# Patient Record
Sex: Female | Born: 1950 | Race: White | Hispanic: No | Marital: Single | Smoking: Never smoker
Health system: Southern US, Community
[De-identification: ages and names within clinical notes are randomized; demographics above are authoritative.]

## PROBLEM LIST (undated history)

## (undated) DIAGNOSIS — E079 Disorder of thyroid, unspecified: Secondary | ICD-10-CM

## (undated) DIAGNOSIS — K635 Polyp of colon: Secondary | ICD-10-CM

## (undated) DIAGNOSIS — E119 Type 2 diabetes mellitus without complications: Secondary | ICD-10-CM

## (undated) DIAGNOSIS — K219 Gastro-esophageal reflux disease without esophagitis: Secondary | ICD-10-CM

## (undated) DIAGNOSIS — F419 Anxiety disorder, unspecified: Secondary | ICD-10-CM

## (undated) DIAGNOSIS — F32A Depression, unspecified: Secondary | ICD-10-CM

## (undated) DIAGNOSIS — F319 Bipolar disorder, unspecified: Secondary | ICD-10-CM

## (undated) DIAGNOSIS — K2270 Barrett's esophagus without dysplasia: Secondary | ICD-10-CM

## (undated) DIAGNOSIS — J45909 Unspecified asthma, uncomplicated: Secondary | ICD-10-CM

## (undated) DIAGNOSIS — F329 Major depressive disorder, single episode, unspecified: Secondary | ICD-10-CM

## (undated) DIAGNOSIS — D649 Anemia, unspecified: Secondary | ICD-10-CM

## (undated) HISTORY — PX: COLONOSCOPY WITH ESOPHAGOGASTRODUODENOSCOPY (EGD): SHX5779

## (undated) HISTORY — PX: CHOLECYSTECTOMY: SHX55

---

## 2004-05-26 ENCOUNTER — Ambulatory Visit: Payer: Self-pay | Admitting: Obstetrics and Gynecology

## 2005-01-14 ENCOUNTER — Ambulatory Visit: Payer: Self-pay | Admitting: General Surgery

## 2005-03-09 ENCOUNTER — Emergency Department: Payer: Self-pay | Admitting: Emergency Medicine

## 2006-07-26 ENCOUNTER — Ambulatory Visit: Payer: Self-pay | Admitting: Obstetrics and Gynecology

## 2007-08-24 ENCOUNTER — Ambulatory Visit: Payer: Self-pay | Admitting: Nurse Practitioner

## 2007-08-28 ENCOUNTER — Emergency Department: Payer: Self-pay | Admitting: Emergency Medicine

## 2007-11-04 ENCOUNTER — Ambulatory Visit: Payer: Self-pay | Admitting: Internal Medicine

## 2008-10-05 ENCOUNTER — Ambulatory Visit: Payer: Self-pay | Admitting: Nurse Practitioner

## 2008-11-11 ENCOUNTER — Ambulatory Visit: Payer: Self-pay | Admitting: Internal Medicine

## 2009-11-21 ENCOUNTER — Ambulatory Visit: Payer: Self-pay | Admitting: Nurse Practitioner

## 2010-12-11 ENCOUNTER — Ambulatory Visit: Payer: Self-pay | Admitting: Family Medicine

## 2011-02-24 ENCOUNTER — Ambulatory Visit: Payer: Self-pay | Admitting: Ophthalmology

## 2011-03-10 ENCOUNTER — Ambulatory Visit: Payer: Self-pay | Admitting: Ophthalmology

## 2011-08-14 ENCOUNTER — Ambulatory Visit: Payer: Self-pay

## 2011-09-08 ENCOUNTER — Ambulatory Visit: Payer: Self-pay | Admitting: Orthopedic Surgery

## 2012-03-07 ENCOUNTER — Ambulatory Visit: Payer: Self-pay | Admitting: Family Medicine

## 2012-12-21 ENCOUNTER — Ambulatory Visit: Payer: Self-pay | Admitting: Family Medicine

## 2013-10-10 ENCOUNTER — Ambulatory Visit: Payer: Self-pay | Admitting: Gastroenterology

## 2013-10-14 LAB — PATHOLOGY REPORT

## 2013-12-23 ENCOUNTER — Emergency Department: Payer: Self-pay | Admitting: Emergency Medicine

## 2014-10-07 NOTE — Op Note (Signed)
PATIENT NAME:  Brooke Leon, Brooke Leon MR#:  [id number] DATE OF BIRTH:  [birth year]  DATE OF PROCEDURE:  09/08/2011  PREOPERATIVE DIAGNOSIS: Left knee medial meniscus tear, osteoarthritis.   POSTOPERATIVE DIAGNOSIS: Left knee medial meniscus tear, osteoarthritis.   PROCEDURE: Arthroscopy of left knee with partial medial meniscectomy.   SURGEON: Leitha SchullerMichael J. Nevyn Bossman, MD   ANESTHESIA: General.  DESCRIPTION OF PROCEDURE: The patient was brought to the operating room and after adequate anesthesia was obtained, the left leg was placed in the arthroscopic legholder and a tourniquet applied. The leg was prepped and draped in the usual sterile manner and patient identification and time out procedures were completed. An inferolateral portal was made and the arthroscope was introduced. Initial inspection revealed significant chondromalacia with partial thickness loss of the articular cartilage within the femoral trochlea and over most of the patellar surface, although no exposed bone. The patella tracked well. Coming around medially, an inferomedial portal was made and a probe introduced. There was a complex tear of the posterior horn of the medial meniscus extending to the middle portion. This was explored and had a horizontal and vertical component present. After probing this inspection of the articular cartilage revealed moderate degenerative changes throughout the medial compartment, but no flap or other treatable condition. Going to the notch, the anterior cruciate ligament was intact laterally. There was a small flap of articular cartilage on the tibia that was loose and appeared to be fissuring, it was a loose flap, which was ablated with the use of an ArthroCare wand. Otherwise the lateral compartment was normal. At this point, the gutters were checked and there were no loose bodies. A meniscal punch was used to resect the posterior horn along with a portion of the middle third, which was also involved. Following this  the knee was thoroughly irrigated with an outflow cannula placed through the inferomedial portal and the large fragments of meniscus removed. Finally, an ArthroCare wand was used to ablate the tissue and get a stable margin for the meniscus. All instrumentation was withdrawn. The wound was closed with simple interrupted 4-0 nylon. 20 mL of 0.5% Sensorcaine with epinephrine were infiltrated. Xeroform, 4 x 4'Leon, Webril, and Ace wrap were applied. The patient was sent to the recovery room in stable condition.  ESTIMATED BLOOD LOSS: Minimal.  COMPLICATIONS: None.  SPECIMENS: None. ____________________________ Leitha SchullerMichael J. Tanairi Cypert, MD mjm:slb D: 09/08/2011 22:23:11 ET T: 09/09/2011 09:11:28 ET JOB#: [id number]  cc: Leitha SchullerMichael J. Marjo Grosvenor, MD, <Dictator>  Leitha SchullerMICHAEL J Shamarie Call MD ELECTRONICALLY SIGNED 09/10/2011 6:21

## 2016-04-14 ENCOUNTER — Other Ambulatory Visit: Payer: Self-pay | Admitting: Family Medicine

## 2016-04-14 DIAGNOSIS — R1011 Right upper quadrant pain: Secondary | ICD-10-CM

## 2016-04-17 ENCOUNTER — Ambulatory Visit
Admission: RE | Admit: 2016-04-17 | Discharge: 2016-04-17 | Disposition: A | Discharge status: Home / Self Care | Payer: BC Managed Care – PPO | Source: Ambulatory Visit | Attending: Family Medicine | Admitting: Family Medicine

## 2016-04-17 DIAGNOSIS — R1011 Right upper quadrant pain: Secondary | ICD-10-CM | POA: Insufficient documentation

## 2016-04-17 DIAGNOSIS — R932 Abnormal findings on diagnostic imaging of liver and biliary tract: Secondary | ICD-10-CM | POA: Insufficient documentation

## 2016-04-17 DIAGNOSIS — Z9049 Acquired absence of other specified parts of digestive tract: Secondary | ICD-10-CM | POA: Insufficient documentation

## 2016-09-02 ENCOUNTER — Encounter: Payer: Self-pay | Admitting: *Deleted

## 2016-09-03 ENCOUNTER — Ambulatory Visit
Admission: RE | Admit: 2016-09-03 | Discharge: 2016-09-03 | Disposition: A | Discharge status: Home / Self Care | Payer: BC Managed Care – PPO | Source: Ambulatory Visit | Attending: Gastroenterology | Admitting: Gastroenterology

## 2016-09-03 ENCOUNTER — Ambulatory Visit: Payer: BC Managed Care – PPO | Admitting: *Deleted

## 2016-09-03 ENCOUNTER — Encounter: Payer: Self-pay | Admitting: *Deleted

## 2016-09-03 ENCOUNTER — Encounter
Admission: RE | Disposition: A | Discharge status: Home / Self Care | Payer: Self-pay | Source: Ambulatory Visit | Attending: Gastroenterology

## 2016-09-03 DIAGNOSIS — Z86010 Personal history of colonic polyps: Secondary | ICD-10-CM | POA: Insufficient documentation

## 2016-09-03 DIAGNOSIS — K2270 Barrett's esophagus without dysplasia: Secondary | ICD-10-CM | POA: Diagnosis not present

## 2016-09-03 DIAGNOSIS — Z8371 Family history of colonic polyps: Secondary | ICD-10-CM | POA: Insufficient documentation

## 2016-09-03 DIAGNOSIS — K210 Gastro-esophageal reflux disease with esophagitis: Secondary | ICD-10-CM | POA: Diagnosis not present

## 2016-09-03 DIAGNOSIS — K449 Diaphragmatic hernia without obstruction or gangrene: Secondary | ICD-10-CM | POA: Insufficient documentation

## 2016-09-03 DIAGNOSIS — F319 Bipolar disorder, unspecified: Secondary | ICD-10-CM | POA: Insufficient documentation

## 2016-09-03 DIAGNOSIS — K317 Polyp of stomach and duodenum: Secondary | ICD-10-CM | POA: Insufficient documentation

## 2016-09-03 DIAGNOSIS — Z888 Allergy status to other drugs, medicaments and biological substances status: Secondary | ICD-10-CM | POA: Insufficient documentation

## 2016-09-03 DIAGNOSIS — Z79899 Other long term (current) drug therapy: Secondary | ICD-10-CM | POA: Diagnosis not present

## 2016-09-03 DIAGNOSIS — Z882 Allergy status to sulfonamides status: Secondary | ICD-10-CM | POA: Insufficient documentation

## 2016-09-03 DIAGNOSIS — F419 Anxiety disorder, unspecified: Secondary | ICD-10-CM | POA: Diagnosis not present

## 2016-09-03 DIAGNOSIS — E079 Disorder of thyroid, unspecified: Secondary | ICD-10-CM | POA: Diagnosis not present

## 2016-09-03 DIAGNOSIS — Z7984 Long term (current) use of oral hypoglycemic drugs: Secondary | ICD-10-CM | POA: Insufficient documentation

## 2016-09-03 DIAGNOSIS — Z1211 Encounter for screening for malignant neoplasm of colon: Secondary | ICD-10-CM | POA: Insufficient documentation

## 2016-09-03 DIAGNOSIS — K5730 Diverticulosis of large intestine without perforation or abscess without bleeding: Secondary | ICD-10-CM | POA: Insufficient documentation

## 2016-09-03 DIAGNOSIS — Z880 Allergy status to penicillin: Secondary | ICD-10-CM | POA: Insufficient documentation

## 2016-09-03 DIAGNOSIS — K760 Fatty (change of) liver, not elsewhere classified: Secondary | ICD-10-CM | POA: Insufficient documentation

## 2016-09-03 HISTORY — PX: ESOPHAGOGASTRODUODENOSCOPY (EGD) WITH PROPOFOL: SHX5813

## 2016-09-03 HISTORY — DX: Polyp of colon: K63.5

## 2016-09-03 HISTORY — DX: Bipolar disorder, unspecified: F31.9

## 2016-09-03 HISTORY — PX: COLONOSCOPY WITH PROPOFOL: SHX5780

## 2016-09-03 HISTORY — DX: Major depressive disorder, single episode, unspecified: F32.9

## 2016-09-03 HISTORY — DX: Anemia, unspecified: D64.9

## 2016-09-03 HISTORY — DX: Gastro-esophageal reflux disease without esophagitis: K21.9

## 2016-09-03 HISTORY — DX: Depression, unspecified: F32.A

## 2016-09-03 HISTORY — DX: Anxiety disorder, unspecified: F41.9

## 2016-09-03 HISTORY — DX: Barrett's esophagus without dysplasia: K22.70

## 2016-09-03 HISTORY — DX: Disorder of thyroid, unspecified: E07.9

## 2016-09-03 LAB — GLUCOSE, CAPILLARY: GLUCOSE-CAPILLARY: 128 mg/dL — AB (ref 65–99)

## 2016-09-03 SURGERY — ESOPHAGOGASTRODUODENOSCOPY (EGD) WITH PROPOFOL
Anesthesia: General

## 2016-09-03 MED ORDER — PROPOFOL 500 MG/50ML IV EMUL
INTRAVENOUS | Status: AC
  Filled 2016-09-03: qty 50

## 2016-09-03 MED ORDER — SODIUM CHLORIDE 0.9 % IV SOLN: INTRAVENOUS | Status: DC

## 2016-09-03 MED ORDER — PROPOFOL 10 MG/ML IV BOLUS
INTRAVENOUS | Status: AC
  Filled 2016-09-03: qty 20

## 2016-09-03 MED ORDER — EPHEDRINE SULFATE 50 MG/ML IJ SOLN
INTRAMUSCULAR | Status: DC | PRN
  Administered 2016-09-03: 10 mg via INTRAVENOUS

## 2016-09-03 MED ORDER — SODIUM CHLORIDE 0.9 % IV SOLN
INTRAVENOUS | Status: DC
  Administered 2016-09-03: 12:00:00 via INTRAVENOUS

## 2016-09-03 MED ORDER — PROPOFOL 500 MG/50ML IV EMUL
INTRAVENOUS | Status: DC | PRN
  Administered 2016-09-03: 100 ug/kg/min via INTRAVENOUS

## 2016-09-03 NOTE — Anesthesia Preprocedure Evaluation (Signed)
Anesthesia Evaluation  Patient identified by MRN, date of birth, ID band Patient awake    Reviewed: Allergy & Precautions, H&P , NPO status , Patient's Chart, lab work & pertinent test results, reviewed documented beta blocker date and time   History of Anesthesia Complications Negative for: history of anesthetic complications  Airway Mallampati: III  TM Distance: >3 FB Neck ROM: full    Dental  (+) Caps, Teeth Intact   Pulmonary neg shortness of breath, sleep apnea and Continuous Positive Airway Pressure Ventilation , neg COPD, neg recent URI,           Cardiovascular Exercise Tolerance: Good negative cardio ROS       Neuro/Psych PSYCHIATRIC DISORDERS (Bipolar) negative neurological ROS     GI/Hepatic GERD  ,NAFLD   Endo/Other  diabetesHypothyroidism   Renal/GU negative Renal ROS  negative genitourinary   Musculoskeletal   Abdominal   Peds  Hematology negative hematology ROS (+)   Anesthesia Other Findings Past Medical History: No date: Anemia No date: Anxiety No date: Barrett esophagus No date: Bipolar 1 disorder (HCC) No date: Colon polyp No date: Depression No date: GERD (gastroesophageal reflux disease) No date: Thyroid disease   Reproductive/Obstetrics negative OB ROS                             Anesthesia Physical Anesthesia Plan  ASA: III  Anesthesia Plan: General   Post-op Pain Management:    Induction:   Airway Management Planned:   Additional Equipment:   Intra-op Plan:   Post-operative Plan:   Informed Consent: I have reviewed the patients History and Physical, chart, labs and discussed the procedure including the risks, benefits and alternatives for the proposed anesthesia with the patient or authorized representative who has indicated his/her understanding and acceptance.   Dental Advisory Given  Plan Discussed with: Anesthesiologist, CRNA and  Surgeon  Anesthesia Plan Comments:         Anesthesia Quick Evaluation

## 2016-09-03 NOTE — Op Note (Signed)
[facility] Gastroenterology Patient Name: Brooke Leon Procedure Date: 09/03/2016 11:15 AM MRN: [id number] Account #: [id number] Date of Birth: 12/12/1950 Admit Type: Outpatient Age: [age] [facility] END[facility] ROOM 1 Gender: Female Note Status: Finalized Procedure:            Upper GI endoscopy Indications:          Follow-up of Barrett's esophagus Providers:            Christena DeemMartin U. Charle Mclaurin, MD Referring MD:         Rhona LeavensJames F. Burnett ShengHedrick, MD (Referring MD) Medicines:            Monitored Anesthesia Care Complications:        No immediate complications. Procedure:            Pre-Anesthesia Assessment:                       - ASA Grade Assessment: II - A patient with mild                        systemic disease.                       After obtaining informed consent, the endoscope was                        passed under direct vision. Throughout the procedure,                        the patient's blood pressure, pulse, and oxygen                        saturations were monitored continuously. The Endoscope                        was introduced through the mouth, and advanced to the                        third part of duodenum. The upper GI endoscopy was                        accomplished without difficulty. Findings:      There were esophageal mucosal changes consistent with short-segment       Barrett's esophagus present at the gastroesophageal junction. The       maximum longitudinal extent of these mucosal changes was 2 cm in length.       Mucosa was biopsied with a cold forceps for histology in 4 quadrants at       the gastroesophageal junction. One specimen bottle was sent to pathology.      A small to medium-sized hiatal hernia was found. The Z-line was a       variable distance from incisors; the hiatal hernia was sliding.      A few 1 to 3 mm sessile polyps with no bleeding and no stigmata of       recent bleeding were found in the gastric body. Biopsies were taken  with       a cold forceps for histology.      The cardia and gastric fundus were normal on retroflexion otherwise.      The examined duodenum was normal. Impression:           -  Esophageal mucosal changes consistent with                        short-segment Barrett's esophagus. Biopsied.                       - Medium-sized hiatal hernia.                       - A few gastric polyps. Biopsied.                       - Normal examined duodenum. Recommendation:       - Discharge patient to home.                       - Continue present medications.                       - Return to GI clinic in 1 month. Procedure Code(s):    --- Professional ---                       [phone], Esophagogastroduodenoscopy, flexible, transoral;                        with biopsy, single or multiple Diagnosis Code(s):    --- Professional ---                       K22.70, Barrett's esophagus without dysplasia                       K44.9, Diaphragmatic hernia without obstruction or                        gangrene                       K31.7, Polyp of stomach and duodenum CPT copyright 2016 [employer]. All rights reserved. The codes documented in this report are preliminary and upon coder review may  be revised to meet current compliance requirements. Christena Deem, MD 09/03/2016 12:44:35 PM This report has been signed electronically. Number of Addenda: 0 Note Initiated On: 09/03/2016 11:15 AM      [facility]

## 2016-09-03 NOTE — Anesthesia Post-op Follow-up Note (Cosign Needed)
Anesthesia QCDR form completed.        

## 2016-09-03 NOTE — Transfer of Care (Signed)
Immediate Anesthesia Transfer of Care Note  Patient: Brooke Leon  Procedure(s) Performed: Procedure(s): ESOPHAGOGASTRODUODENOSCOPY (EGD) WITH PROPOFOL (N/A) COLONOSCOPY WITH PROPOFOL (N/A)  Patient Location: PACU  Anesthesia Type:General  Level of Consciousness: awake, alert  and oriented  Airway & Oxygen Therapy: Patient Spontanous Breathing and Patient connected to nasal cannula oxygen  Post-op Assessment: Report given to RN and Post -op Vital signs reviewed and stable  Post vital signs: Reviewed and stable  Last Vitals:  Vitals:   09/03/16 1126  BP: 122/71  Pulse: 71  Resp: 16  Temp: 36.4 C    Last Pain:  Vitals:   09/03/16 1126  TempSrc: Tympanic         Complications: No apparent anesthesia complications

## 2016-09-03 NOTE — H&P (Signed)
Outpatient short stay form Pre-procedure 09/03/2016 12:15 PM Brooke DeemMartin U Skulskie MD  Primary Physician: Dr. Jerl MinaJames Hedrick  Reason for visit:  EGD and Colonoscopy  History of present illness:  Patient is a 66 year old female presenting today as above. She tolerated her prep well. She takes no aspirin or blood thinning agents. He has a personal history of Barrett's esophagus initially noted on biopsy about 2 years ago. She also has a family history of colon polyps and multiple primary relatives and possibly colon cancer and a primary relative as well.    Current Facility-Administered Medications:  .  0.9 %  sodium chloride infusion, , Intravenous, Continuous, Brooke DeemMartin U Skulskie, MD .  0.9 %  sodium chloride infusion, , Intravenous, Continuous, Brooke DeemMartin U Skulskie, MD, Last Rate: 20 mL/hr at 09/03/16 1158 .  0.9 %  sodium chloride infusion, , Intravenous, Continuous, Brooke DeemMartin U Skulskie, MD  Prescriptions Prior to Admission  Medication Sig Dispense Refill Last Dose  . ALPRAZolam (XANAX) 1 MG tablet Take 1 mg by mouth at bedtime as needed for anxiety.   09/03/2016 at 0300  . EPINEPHrine (EPIPEN 2-PAK) 0.3 mg/0.3 mL IJ SOAJ injection Inject into the muscle once.     . lamoTRIgine (LAMICTAL) 100 MG tablet Take 100 mg by mouth daily.   09/03/2016 at 0300  . levothyroxine (SYNTHROID, LEVOTHROID) 137 MCG tablet Take 137 mcg by mouth daily before breakfast.   09/03/2016 at 0300  . metFORMIN (GLUCOPHAGE) 500 MG tablet Take 500 mg by mouth 2 (two) times daily with a meal.   09/03/2016 at 0300  . omeprazole (PRILOSEC) 40 MG capsule Take 40 mg by mouth daily.   09/03/2016 at 0300  . simvastatin (ZOCOR) 10 MG tablet Take 10 mg by mouth daily.   09/03/2016 at 0300  . venlafaxine XR (EFFEXOR-XR) 150 MG 24 hr capsule Take 150 mg by mouth daily with breakfast.   09/03/2016 at 0300     Allergies  Allergen Reactions  . Benadryl [Diphenhydramine] Other (See Comments)    "Skin Crawling" sensation  . Penicillins Other  (See Comments)    Told by parents that she had a reaction as a child  . Sulfa Antibiotics Other (See Comments)    "Skin Crawling" sensation     Past Medical History:  Diagnosis Date  . Anemia   . Anxiety   . Barrett esophagus   . Bipolar 1 disorder (HCC)   . Colon polyp   . Depression   . GERD (gastroesophageal reflux disease)   . Thyroid disease     Review of systems:      Physical Exam    Heart and lungs: Regular rate and rhythm without rub or gallop, lungs are bilaterally clear.    HEENT: Normocephalic atraumatic eyes are anicteric    Other:     Pertinant exam for procedure: Soft nontender nondistended bowel sounds positive normoactive.    Planned proceedures: EGD, colonoscopy and indicated procedures. I have discussed the risks benefits and complications of procedures to include not limited to bleeding, infection, perforation and the risk of sedation and the patient wishes to proceed.    Brooke DeemMartin U Skulskie, MD Gastroenterology 09/03/2016  12:15 PM

## 2016-09-03 NOTE — Op Note (Addendum)
[facility] Gastroenterology Patient Name: Brooke Leon Procedure Date: 09/03/2016 11:14 AM MRN: [id number] Account #: [id number] Date of Birth: Jul 20, 1950 Admit Type: Outpatient Age: 66 Room: [facility] ENDO ROOM 1 Gender: Female Note Status: Finalized Procedure:            Colonoscopy Indications:          Family history of colonic polyps in a first-degree                        relative Providers:            Christena Deem, MD Medicines:            Monitored Anesthesia Care Complications:        No immediate complications. Procedure:            Pre-Anesthesia Assessment:                       - ASA Grade Assessment: II - A patient with mild                        systemic disease.                       After obtaining informed consent, the colonoscope was                        passed under direct vision. Throughout the procedure,                        the patient's blood pressure, pulse, and oxygen                        saturations were monitored continuously. The                        Colonoscope was introduced through the anus and                        advanced to the the cecum, identified by appendiceal                        orifice and ileocecal valve. The colonoscopy was                        performed with moderate difficulty due to a tortuous                        colon. Successful completion of the procedure was aided                        by changing the patient to a prone position. The                        patient tolerated the procedure well. The quality of                        the bowel preparation was fair. Findings:      Multiple small-mouthed diverticula were found in the sigmoid colon,       descending colon, transverse colon and ascending colon.  The digital rectal exam was normal.      The exam was otherwise without abnormality. Impression:           - Preparation of the colon was fair.                       - Diverticulosis  in the sigmoid colon, in the                        descending colon, in the transverse colon and in the                        ascending colon.                       - No specimens collected. Recommendation:       - Discharge patient to home.                       - Soft diet today, then advance as tolerated to advance                        diet as tolerated. Procedure Code(s):    --- Professional ---                       [id number], Colonoscopy, flexible; diagnostic, including                        collection of specimen(s) by brushing or washing, when                        performed (separate procedure) Diagnosis Code(s):    --- Professional ---                       Z83.71, Family history of colonic polyps                       K57.30, Diverticulosis of large intestine without                        perforation or abscess without bleeding CPT copyright 2016 American Medical Association. All rights reserved. The codes documented in this report are preliminary and upon coder review may  be revised to meet current compliance requirements. Christena DeemMartin U Skulskie, MD 09/03/2016 1:19:45 PM This report has been signed electronically. Number of Addenda: 0 Note Initiated On: 09/03/2016 11:14 AM Scope Withdrawal Time: 0 hours 10 minutes 17 seconds  Total Procedure Duration: 0 hours 28 minutes 2 seconds       [facility]

## 2016-09-04 ENCOUNTER — Encounter: Payer: Self-pay | Admitting: Gastroenterology

## 2016-09-04 LAB — SURGICAL PATHOLOGY

## 2016-09-07 NOTE — Anesthesia Postprocedure Evaluation (Signed)
Anesthesia Post Note  Patient: Brooke Leon  Procedure(s) Performed: Procedure(s) (LRB): ESOPHAGOGASTRODUODENOSCOPY (EGD) WITH PROPOFOL (N/A) COLONOSCOPY WITH PROPOFOL (N/A)  Patient location during evaluation: PACU Anesthesia Type: General Level of consciousness: awake and alert Pain management: pain level controlled Vital Signs Assessment: post-procedure vital signs reviewed and stable Respiratory status: spontaneous breathing, nonlabored ventilation, respiratory function stable and patient connected to nasal cannula oxygen Cardiovascular status: blood pressure returned to baseline and stable Postop Assessment: no signs of nausea or vomiting Anesthetic complications: no     Last Vitals:  Vitals:   09/03/16 1340 09/03/16 1350  BP: 108/67 101/70  Pulse: 66 65  Resp: 16 15  Temp:      Last Pain:  Vitals:   09/03/16 1320  TempSrc: Tympanic                 Yevette EdwardsJames G Adams

## 2016-09-14 ENCOUNTER — Other Ambulatory Visit: Payer: Self-pay | Admitting: Family Medicine

## 2016-09-14 DIAGNOSIS — Z1231 Encounter for screening mammogram for malignant neoplasm of breast: Secondary | ICD-10-CM

## 2016-10-06 ENCOUNTER — Ambulatory Visit: Payer: BC Managed Care – PPO

## 2016-10-28 ENCOUNTER — Ambulatory Visit: Payer: BC Managed Care – PPO

## 2017-01-04 ENCOUNTER — Ambulatory Visit
Admission: RE | Admit: 2017-01-04 | Discharge: 2017-01-04 | Disposition: A | Discharge status: Home / Self Care | Payer: BC Managed Care – PPO | Source: Ambulatory Visit | Attending: Family Medicine | Admitting: Family Medicine

## 2017-01-04 DIAGNOSIS — Z1231 Encounter for screening mammogram for malignant neoplasm of breast: Secondary | ICD-10-CM | POA: Diagnosis present

## 2017-12-08 ENCOUNTER — Ambulatory Visit: Payer: Medicare Other | Attending: Neurology

## 2017-12-08 DIAGNOSIS — G4733 Obstructive sleep apnea (adult) (pediatric): Secondary | ICD-10-CM | POA: Insufficient documentation

## 2017-12-08 DIAGNOSIS — G4761 Periodic limb movement disorder: Secondary | ICD-10-CM | POA: Diagnosis not present

## 2017-12-08 DIAGNOSIS — G4730 Sleep apnea, unspecified: Secondary | ICD-10-CM | POA: Diagnosis present

## 2018-01-05 ENCOUNTER — Ambulatory Visit: Payer: Medicare Other | Attending: Neurology

## 2018-01-05 DIAGNOSIS — G4761 Periodic limb movement disorder: Secondary | ICD-10-CM | POA: Insufficient documentation

## 2018-01-05 DIAGNOSIS — G4733 Obstructive sleep apnea (adult) (pediatric): Secondary | ICD-10-CM | POA: Insufficient documentation

## 2018-02-24 ENCOUNTER — Emergency Department
Admission: EM | Admit: 2018-02-24 | Discharge: 2018-02-24 | Disposition: A | Discharge status: Home / Self Care | Payer: Medicare Other | Attending: Emergency Medicine | Admitting: Emergency Medicine

## 2018-02-24 ENCOUNTER — Other Ambulatory Visit: Payer: Self-pay

## 2018-02-24 DIAGNOSIS — Z79899 Other long term (current) drug therapy: Secondary | ICD-10-CM | POA: Diagnosis not present

## 2018-02-24 DIAGNOSIS — H5711 Ocular pain, right eye: Secondary | ICD-10-CM

## 2018-02-24 DIAGNOSIS — Z7984 Long term (current) use of oral hypoglycemic drugs: Secondary | ICD-10-CM | POA: Diagnosis not present

## 2018-02-24 DIAGNOSIS — H40051 Ocular hypertension, right eye: Secondary | ICD-10-CM | POA: Diagnosis not present

## 2018-02-24 MED ORDER — FLUORESCEIN SODIUM 1 MG OP STRP
1.0000 | ORAL_STRIP | Freq: Once | OPHTHALMIC | Status: AC
  Administered 2018-02-24: 1 via OPHTHALMIC
  Filled 2018-02-24: qty 1

## 2018-02-24 MED ORDER — TIMOLOL MALEATE 0.5 % OP SOLN
1.0000 [drp] | Freq: Once | OPHTHALMIC | Status: AC
  Administered 2018-02-24: 1 [drp] via OPHTHALMIC
  Filled 2018-02-24: qty 5

## 2018-02-24 MED ORDER — TETRACAINE HCL 0.5 % OP SOLN
2.0000 [drp] | Freq: Once | OPHTHALMIC | Status: AC
  Administered 2018-02-24: 2 [drp] via OPHTHALMIC
  Filled 2018-02-24: qty 4

## 2018-02-24 MED ORDER — BRIMONIDINE TARTRATE 0.2 % OP SOLN: 1.0000 [drp] | Freq: Three times a day (TID) | OPHTHALMIC | 0 refills | Status: AC

## 2018-02-24 MED ORDER — MELOXICAM 7.5 MG PO TABS
15.0000 mg | ORAL_TABLET | Freq: Once | ORAL | Status: AC
  Administered 2018-02-24: 15 mg via ORAL
  Filled 2018-02-24: qty 2

## 2018-02-24 MED ORDER — BRIMONIDINE TARTRATE 0.15 % OP SOLN
1.0000 [drp] | Freq: Once | OPHTHALMIC | Status: AC
  Administered 2018-02-24: 1 [drp] via OPHTHALMIC
  Filled 2018-02-24: qty 5

## 2018-02-24 MED ORDER — TIMOLOL HEMIHYDRATE 0.5 % OP SOLN: 1.0000 [drp] | Freq: Two times a day (BID) | OPHTHALMIC | 0 refills | Status: AC

## 2018-02-24 NOTE — ED Notes (Signed)
Visual acuity:  L eye 20/40 R eye: 20/100

## 2018-02-24 NOTE — ED Triage Notes (Addendum)
Pt c/o R eye watering and pain. Eye is red and draining. Woke up with it this AM. Saw PCP who sent pt here. States washed eye out at home. Denies having anything in eye that she knows of or can think of. Alert, oriented, ambulatory.

## 2018-02-24 NOTE — ED Notes (Signed)
See triage note  Presents with some pain to right eye    States she is not sure if she got anything in her eye

## 2018-02-24 NOTE — ED Provider Notes (Signed)
[facility] Emergency Department Provider Note  ____________________________________________  Time seen: Approximately 5:34 PM  I have reviewed the triage vital signs and the nursing notes.   HISTORY  Chief Complaint Eye Problem    HPI Brooke Leon is a [age] y.o. female who presents the emergency department complaining of right eye redness, pain, vision changes.  Patient reports that she woke up this morning had a sharp intraocular pain.  Patient also noticed that things appear blurry.  Patient reports that she called her primary care, was assessed in the office and sent to the emergency department for further evaluation.  Patient denies any known trauma.  She states that she has had increased hearing but has had no purulent drainage from same.  Patient does have a history of bilateral lens replacement several years prior.  Patient denies any complications from same.  She describes current pain as a sharp/stabbing/aching pain intraocularly to the right side.  No symptoms to the left.    Past Medical History:  Diagnosis Date  . Anemia   . Anxiety   . Barrett esophagus   . Bipolar 1 disorder (HCC)   . Colon polyp   . Depression   . GERD (gastroesophageal reflux disease)   . Thyroid disease     There are no active problems to display for this patient.   Past Surgical History:  Procedure Laterality Date  . CHOLECYSTECTOMY     1998  . COLONOSCOPY WITH ESOPHAGOGASTRODUODENOSCOPY (EGD)    . COLONOSCOPY WITH PROPOFOL N/A 09/03/2016   Procedure: COLONOSCOPY WITH PROPOFOL;  Surgeon: Christena DeemMartin U Skulskie, MD;  [facility];  Service: Endoscopy;  Laterality: N/A;  . ESOPHAGOGASTRODUODENOSCOPY (EGD) WITH PROPOFOL N/A 09/03/2016   Procedure: ESOPHAGOGASTRODUODENOSCOPY (EGD) WITH PROPOFOL;  Surgeon: Christena DeemMartin U Skulskie, MD;  [facility] END[facility];  Service: Endoscopy;  Laterality: N/A;    Prior to Admission medications   Medication Sig Start Date End  Date Taking? Authorizing Provider  ALPRAZolam Prudy Feeler(XANAX) 1 MG tablet Take 1 mg by mouth at bedtime as needed for anxiety.    [provider]  brimonidine (ALPHAGAN) 0.2 % ophthalmic solution Place 1 drop into the right eye 3 (three) times daily. 02/24/18   Macy Lingenfelter, Delorise RoyalsJonathan D, PA-C  EPINEPHrine (EPIPEN 2-PAK) 0.3 mg/0.3 mL IJ SOAJ injection Inject into the muscle once.    [provider]  lamoTRIgine (LAMICTAL) 100 MG tablet Take 100 mg by mouth daily.    [provider]  levothyroxine (SYNTHROID, LEVOTHROID) 137 MCG tablet Take 137 mcg by mouth daily before breakfast.    [provider]  metFORMIN (GLUCOPHAGE) 500 MG tablet Take 500 mg by mouth 2 (two) times daily with a meal.    [provider]  omeprazole (PRILOSEC) 40 MG capsule Take 40 mg by mouth daily.    [provider]  simvastatin (ZOCOR) 10 MG tablet Take 10 mg by mouth daily.    [provider]  timolol (BETIMOL) 0.5 % ophthalmic solution Place 1 drop into the right eye 2 (two) times daily. 02/24/18 02/24/19  Elim Peale, Delorise RoyalsJonathan D, PA-C  venlafaxine XR (EFFEXOR-XR) 150 MG 24 hr capsule Take 150 mg by mouth daily with breakfast.    [provider]    Allergies Benadryl [diphenhydramine]; Penicillins; and Sulfa antibiotics  History reviewed. No pertinent family history.  Social History Social History   Tobacco Use  . Smoking status: Never Smoker  . Smokeless tobacco: Never Used  Substance Use Topics  . Alcohol use: No  .  Drug use: No     Review of Systems  Constitutional: No fever/chills Eyes: Blurred vision to the right eye.  Pain to the right eye.  Mild erythema to the right eye.  Excessive tearing. ENT: No upper respiratory complaints. Cardiovascular: no chest pain. Respiratory: no cough. No SOB. Gastrointestinal: No abdominal pain.  No nausea, no vomiting.  Musculoskeletal: Negative for musculoskeletal pain. Skin: Negative for rash, abrasions,  lacerations, ecchymosis. Neurological: Positive for headache but denies focal weakness or numbness. 10-point ROS otherwise negative.  ____________________________________________   PHYSICAL EXAM:  VITAL SIGNS: ED Triage Vitals  Enc Vitals Group     BP 02/24/18 1728 125/67     Pulse Rate 02/24/18 1728 75     Resp 02/24/18 1728 18     Temp 02/24/18 1728 99.1 F (37.3 C)     Temp Source 02/24/18 1728 Oral     SpO2 02/24/18 1728 98 %     Weight 02/24/18 1729 218 lb (98.9 kg)     Height 02/24/18 1729 5\' 8"  (1.727 m)     Head Circumference --      Peak Flow --      Pain Score 02/24/18 1729 8     Pain Loc --      Pain Edu? --      Excl. in [place]? --      Constitutional: Alert and oriented. Well appearing and in no acute distress. Eyes: Conjunctiva on right minimally erythematous.  Conjunctive on left is unremarkable.Marland Kitchen PERRL. EOMI. funduscopic exam bilaterally reveals no significant deficits.  Vasculature, optic disc is grossly unremarkable bilaterally.  No hyphema appreciated.  No visible foreign body in the right eye.  Both eyes are anesthetized using tetracaine.  Fluorescein staining applied to the right eye.  Small area of uptake, 3 o'clock position right eye.  No dendritic pattern.  No visible foreign body.  Tono-Pen pressures with following readings for the right eye: 32, 31, 51, 64.  Left eye: 18, 19, 21, 28. Head: Atraumatic. ENT:      Ears:       Nose: No congestion/rhinnorhea.      Mouth/Throat: Mucous membranes are moist.  Neck: No stridor.    Cardiovascular: Normal rate, regular rhythm. Normal S1 and S2.  Good peripheral circulation. Respiratory: Normal respiratory effort without tachypnea or retractions. Lungs CTAB. Good air entry to the bases with no decreased or absent breath sounds. Musculoskeletal: Full range of motion to all extremities. No gross deformities appreciated. Neurologic:  Normal speech and language. No gross focal neurologic deficits are appreciated.   Cranial nerves II through XII grossly intact. Skin:  Skin is warm, dry and intact. No rash noted. Psychiatric: Mood and affect are normal. Speech and behavior are normal. Patient exhibits appropriate insight and judgement.   ____________________________________________   LABS (all labs ordered are listed, but only abnormal results are displayed)  Labs Reviewed - No data to display ____________________________________________  EKG   ____________________________________________  RADIOLOGY   No results found.  ____________________________________________    PROCEDURES  Procedure(s) performed:    Procedures    Medications  timolol (TIMOPTIC) 0.5 % ophthalmic solution 1 drop (has no administration in time range)  brimonidine (ALPHAGAN) 0.15 % ophthalmic solution 1 drop (has no administration in time range)  meloxicam (MOBIC) tablet 15 mg (has no administration in time range)  fluorescein ophthalmic strip 1 strip (1 strip Right Eye Given by Other 02/24/18 1759)  tetracaine (PONTOCAINE) 0.5 % ophthalmic solution 2 drop (2 drops Both Eyes  Given by Other 02/24/18 1759)     ____________________________________________   INITIAL IMPRESSION / ASSESSMENT AND PLAN / ED COURSE  Pertinent labs & imaging results that were available during my care of the patient were reviewed by me and considered in my medical decision making (see chart for details).  Review of the [place] CSRS was performed in accordance of the [facility] prior to dispensing any controlled drugs.      Patient's diagnosis is consistent with right eye pain, increased ocular pressure right eye.  Patient presents emergency department with right eye pain.  She has a history of bilateral lens replacement.  Patient had nontraumatic pain, redness, excessive tearing, headache.  Exam was overall reassuring with the exception of increased ocular pressures.  I discussed the case with the on-call ophthalmologist, Dr. Sharman Crate who  states that condition is likely not some form of uveitis.  However, with increased pressures, patient is to be started on timolol and Alphagan.  Patient will see ophthalmology in the morning.  Discussed findings with patient and follow-up plan.  She is agreeable with same. Patient is given ED precautions to return to the ED for any worsening or new symptoms.     ____________________________________________  FINAL CLINICAL IMPRESSION(S) / ED DIAGNOSES  Final diagnoses:  Acute right eye pain  Elevated IOP, right      NEW MEDICATIONS STARTED DURING THIS VISIT:  ED Discharge Orders         Ordered    brimonidine (ALPHAGAN) 0.2 % ophthalmic solution  3 times daily     02/24/18 1846    timolol (BETIMOL) 0.5 % ophthalmic solution  2 times daily     02/24/18 1846              This chart was dictated using voice recognition software/Dragon. Despite best efforts to proofread, errors can occur which can change the meaning. Any change was purely unintentional.    Racheal Patches, PA-C 02/24/18 1919    Rockne Menghini, MD 02/24/18 2253

## 2018-05-18 ENCOUNTER — Other Ambulatory Visit: Payer: Self-pay | Admitting: Family Medicine

## 2018-05-18 DIAGNOSIS — Z1231 Encounter for screening mammogram for malignant neoplasm of breast: Secondary | ICD-10-CM

## 2018-08-24 ENCOUNTER — Other Ambulatory Visit: Payer: Self-pay

## 2018-08-24 ENCOUNTER — Ambulatory Visit
Admission: RE | Admit: 2018-08-24 | Discharge: 2018-08-24 | Disposition: A | Discharge status: Home / Self Care | Payer: Medicare Other | Source: Ambulatory Visit | Attending: Family Medicine | Admitting: Family Medicine

## 2018-08-24 DIAGNOSIS — Z1231 Encounter for screening mammogram for malignant neoplasm of breast: Secondary | ICD-10-CM | POA: Insufficient documentation

## 2019-01-27 ENCOUNTER — Other Ambulatory Visit: Payer: Self-pay | Admitting: Family Medicine

## 2019-01-27 DIAGNOSIS — R1031 Right lower quadrant pain: Secondary | ICD-10-CM

## 2019-01-27 DIAGNOSIS — R109 Unspecified abdominal pain: Secondary | ICD-10-CM

## 2019-02-06 ENCOUNTER — Ambulatory Visit
Admission: RE | Admit: 2019-02-06 | Discharge: 2019-02-06 | Disposition: A | Discharge status: Home / Self Care | Payer: Medicare Other | Source: Ambulatory Visit | Attending: Family Medicine | Admitting: Family Medicine

## 2019-02-06 ENCOUNTER — Other Ambulatory Visit: Payer: Self-pay

## 2019-02-06 DIAGNOSIS — R109 Unspecified abdominal pain: Secondary | ICD-10-CM | POA: Insufficient documentation

## 2019-02-06 DIAGNOSIS — R1031 Right lower quadrant pain: Secondary | ICD-10-CM | POA: Insufficient documentation

## 2019-02-06 MED ORDER — IOHEXOL 300 MG/ML  SOLN
100.0000 mL | Freq: Once | INTRAMUSCULAR | Status: AC | PRN
  Administered 2019-02-06: 100 mL via INTRAVENOUS

## 2019-02-09 ENCOUNTER — Other Ambulatory Visit: Payer: Self-pay | Admitting: Family Medicine

## 2019-02-09 ENCOUNTER — Other Ambulatory Visit: Payer: Self-pay | Admitting: *Deleted

## 2019-02-09 DIAGNOSIS — E278 Other specified disorders of adrenal gland: Secondary | ICD-10-CM

## 2019-02-09 DIAGNOSIS — R948 Abnormal results of function studies of other organs and systems: Secondary | ICD-10-CM

## 2019-02-16 ENCOUNTER — Other Ambulatory Visit: Payer: Self-pay

## 2019-02-16 ENCOUNTER — Ambulatory Visit
Admission: RE | Admit: 2019-02-16 | Discharge: 2019-02-16 | Disposition: A | Discharge status: Home / Self Care | Payer: Medicare Other | Source: Ambulatory Visit | Attending: Family Medicine | Admitting: Family Medicine

## 2019-02-16 DIAGNOSIS — E278 Other specified disorders of adrenal gland: Secondary | ICD-10-CM | POA: Diagnosis not present

## 2019-02-16 DIAGNOSIS — R948 Abnormal results of function studies of other organs and systems: Secondary | ICD-10-CM | POA: Diagnosis present

## 2019-02-16 HISTORY — DX: Type 2 diabetes mellitus without complications: E11.9

## 2019-02-16 HISTORY — DX: Unspecified asthma, uncomplicated: J45.909

## 2019-02-16 MED ORDER — IOHEXOL 300 MG/ML  SOLN
100.0000 mL | Freq: Once | INTRAMUSCULAR | Status: AC | PRN
  Administered 2019-02-16: 100 mL via INTRAVENOUS

## 2019-04-23 ENCOUNTER — Emergency Department
Admission: EM | Admit: 2019-04-23 | Discharge: 2019-04-23 | Disposition: A | Discharge status: Home / Self Care | Payer: Medicare Other | Attending: Emergency Medicine | Admitting: Emergency Medicine

## 2019-04-23 ENCOUNTER — Emergency Department: Payer: Medicare Other

## 2019-04-23 DIAGNOSIS — E079 Disorder of thyroid, unspecified: Secondary | ICD-10-CM | POA: Insufficient documentation

## 2019-04-23 DIAGNOSIS — Z23 Encounter for immunization: Secondary | ICD-10-CM | POA: Diagnosis not present

## 2019-04-23 DIAGNOSIS — Z2914 Encounter for prophylactic rabies immune globin: Secondary | ICD-10-CM | POA: Diagnosis not present

## 2019-04-23 DIAGNOSIS — Y9302 Activity, running: Secondary | ICD-10-CM | POA: Diagnosis not present

## 2019-04-23 DIAGNOSIS — Y998 Other external cause status: Secondary | ICD-10-CM | POA: Insufficient documentation

## 2019-04-23 DIAGNOSIS — E119 Type 2 diabetes mellitus without complications: Secondary | ICD-10-CM | POA: Insufficient documentation

## 2019-04-23 DIAGNOSIS — J45909 Unspecified asthma, uncomplicated: Secondary | ICD-10-CM | POA: Insufficient documentation

## 2019-04-23 DIAGNOSIS — W5581XA Bitten by other mammals, initial encounter: Secondary | ICD-10-CM | POA: Diagnosis not present

## 2019-04-23 DIAGNOSIS — S61451A Open bite of right hand, initial encounter: Secondary | ICD-10-CM | POA: Insufficient documentation

## 2019-04-23 DIAGNOSIS — S61452A Open bite of left hand, initial encounter: Secondary | ICD-10-CM | POA: Diagnosis present

## 2019-04-23 DIAGNOSIS — Z888 Allergy status to other drugs, medicaments and biological substances status: Secondary | ICD-10-CM | POA: Insufficient documentation

## 2019-04-23 DIAGNOSIS — Z882 Allergy status to sulfonamides status: Secondary | ICD-10-CM | POA: Diagnosis not present

## 2019-04-23 DIAGNOSIS — Y929 Unspecified place or not applicable: Secondary | ICD-10-CM | POA: Insufficient documentation

## 2019-04-23 DIAGNOSIS — T148XXA Other injury of unspecified body region, initial encounter: Secondary | ICD-10-CM

## 2019-04-23 DIAGNOSIS — Z203 Contact with and (suspected) exposure to rabies: Secondary | ICD-10-CM | POA: Insufficient documentation

## 2019-04-23 DIAGNOSIS — Z7984 Long term (current) use of oral hypoglycemic drugs: Secondary | ICD-10-CM | POA: Insufficient documentation

## 2019-04-23 DIAGNOSIS — Z79899 Other long term (current) drug therapy: Secondary | ICD-10-CM | POA: Diagnosis not present

## 2019-04-23 DIAGNOSIS — Z880 Allergy status to penicillin: Secondary | ICD-10-CM | POA: Diagnosis not present

## 2019-04-23 MED ORDER — HYDROCODONE-ACETAMINOPHEN 5-325 MG PO TABS: 1.0000 | ORAL_TABLET | Freq: Four times a day (QID) | ORAL | 0 refills | Status: DC | PRN

## 2019-04-23 MED ORDER — RABIES IMMUNE GLOBULIN 150 UNIT/ML IM INJ
300.0000 [IU] | INJECTION | Freq: Once | INTRAMUSCULAR | Status: AC
  Administered 2019-04-23: 300 [IU] via INTRAMUSCULAR
  Filled 2019-04-23: qty 2

## 2019-04-23 MED ORDER — RABIES IMMUNE GLOBULIN 150 UNIT/ML IM INJ
20.0000 [IU]/kg | INJECTION | Freq: Once | INTRAMUSCULAR | Status: DC
  Filled 2019-04-23: qty 12

## 2019-04-23 MED ORDER — TETANUS-DIPHTH-ACELL PERTUSSIS 5-2.5-18.5 LF-MCG/0.5 IM SUSP
0.5000 mL | Freq: Once | INTRAMUSCULAR | Status: AC
  Administered 2019-04-23: 09:00:00 0.5 mL via INTRAMUSCULAR
  Filled 2019-04-23: qty 0.5

## 2019-04-23 MED ORDER — CEPHALEXIN 500 MG PO CAPS: 500.0000 mg | ORAL_CAPSULE | Freq: Three times a day (TID) | ORAL | 0 refills | Status: DC

## 2019-04-23 MED ORDER — RABIES VACCINE, PCEC IM SUSR
1.0000 mL | Freq: Once | INTRAMUSCULAR | Status: AC
  Administered 2019-04-23: 1 mL via INTRAMUSCULAR
  Filled 2019-04-23: qty 1

## 2019-04-23 MED ORDER — RABIES IMMUNE GLOBULIN 150 UNIT/ML IM INJ
1500.0000 [IU] | INJECTION | Freq: Once | INTRAMUSCULAR | Status: AC
  Administered 2019-04-23: 1500 [IU] via INTRAMUSCULAR
  Filled 2019-04-23: qty 10

## 2019-04-23 NOTE — ED Notes (Signed)
Dressings applied to bilat hand/fingers/wrist

## 2019-04-23 NOTE — ED Notes (Signed)
[place] the park and saw a log in the way - pt went to move the "log" and whatever it was attacked her causing injury to bilat hands/wrist Pt is not current on tetanus shot

## 2019-04-23 NOTE — ED Notes (Signed)
Irrigated and scrubbed pt wounds to bilat hands and wrist - swelling and bruising noted to right middle finger and left wrist - also multiple puncture wounds of various sizes - [employer] officer at bedside taking report now

## 2019-04-23 NOTE — Discharge Instructions (Signed)
Clean areas on your hands daily with mild soap and water.  Watch for any signs of infection.  Return to the emergency department for continued rabies immunization.  Take antibiotic 3 times a day for the next 7 days.  Discontinue if any rash.  Return to the emergency department if any difficulties with taking this.  Norco as needed for pain.  You may need to elevate your hands as needed for swelling and help with pain.  Return to the emergency department on 11/ 11 , 11/15, 11/22

## 2019-04-23 NOTE — ED Notes (Signed)
Animal control contacted and they will send Stage manager as soon as possible

## 2019-04-23 NOTE — ED Triage Notes (Signed)
Patient presents to ED for multiple cat bites. Patient [place] [employer] [place] and picked up what she thinks is a cat. The cat started biting her and she threw it down. Patient is complaining that we aren't doing anything for her and she is "just bleeding" and she is "on fire". Patient is not really sure now that it was actually a cat.

## 2019-04-23 NOTE — ED Provider Notes (Signed)
[facility] Emergency Department Provider Note  ___________________________________________   First MD Initiated Contact with Patient 04/23/19 0708     (approximate)  I have reviewed the triage vital signs and the nursing notes.   HISTORY  Chief Complaint Animal Bite   HPI Brooke Leon is a 68 y.o. female presents to the ED after she was attacked by an animal.  Patient states that she was walking early this morning and there was "a log" in her path that she was going to lift and move over.  This was not a log and the animal attacked her.  She is not sure what it was.  She describes how large it was which sounds unlikely that it was a cat.  [place] PD was in to talk with her.  She has multiple puncture wounds on her hands bilaterally and wrist.  No active bleeding.  Patient is not up-to-date on tetanus.  She is upset and tearful.  Is most unlikely that we find out what type of animal bit her.  She rates her pain as an 8 out of 10.     Past Medical History:  Diagnosis Date  . Anemia   . Anxiety   . Asthma   . Barrett esophagus   . Bipolar 1 disorder (HCC)   . Colon polyp   . Depression   . Diabetes mellitus without complication (HCC)   . GERD (gastroesophageal reflux disease)   . Thyroid disease     There are no active problems to display for this patient.   Past Surgical History:  Procedure Laterality Date  . CHOLECYSTECTOMY     1998  . COLONOSCOPY WITH ESOPHAGOGASTRODUODENOSCOPY (EGD)    . COLONOSCOPY WITH PROPOFOL N/A 09/03/2016   Procedure: COLONOSCOPY WITH PROPOFOL;  Surgeon: Christena DeemMartin U Skulskie, MD;  [facility];  Service: Endoscopy;  Laterality: N/A;  . ESOPHAGOGASTRODUODENOSCOPY (EGD) WITH PROPOFOL N/A 09/03/2016   Procedure: ESOPHAGOGASTRODUODENOSCOPY (EGD) WITH PROPOFOL;  Surgeon: Christena DeemMartin U Skulskie, MD;  [facility];  Service: Endoscopy;  Laterality: N/A;    Prior to Admission medications   Medication Sig  Start Date End Date Taking? Authorizing Provider  ALPRAZolam Prudy Feeler(XANAX) 1 MG tablet Take 1 mg by mouth at bedtime as needed for anxiety.    [provider]  brimonidine (ALPHAGAN) 0.2 % ophthalmic solution Place 1 drop into the right eye 3 (three) times daily. 02/24/18   Cuthriell, Delorise RoyalsJonathan D, PA-C  cephALEXin (KEFLEX) 500 MG capsule Take 1 capsule (500 mg total) by mouth 3 (three) times daily. 04/23/19   Tommi RumpsSummers, Rhonda L, PA-C  EPINEPHrine (EPIPEN 2-PAK) 0.3 mg/0.3 mL IJ SOAJ injection Inject into the muscle once.    [provider]  HYDROcodone-acetaminophen (NORCO/VICODIN) 5-325 MG tablet Take 1 tablet by mouth every 6 (six) hours as needed for moderate pain. 04/23/19   Tommi RumpsSummers, Rhonda L, PA-C  lamoTRIgine (LAMICTAL) 100 MG tablet Take 100 mg by mouth daily.    [provider]  levothyroxine (SYNTHROID, LEVOTHROID) 137 MCG tablet Take 137 mcg by mouth daily before breakfast.    [provider]  metFORMIN (GLUCOPHAGE) 500 MG tablet Take 500 mg by mouth 2 (two) times daily with a meal.    [provider]  omeprazole (PRILOSEC) 40 MG capsule Take 40 mg by mouth daily.    [provider]  simvastatin (ZOCOR) 10 MG tablet Take 10 mg by mouth daily.    [provider]  venlafaxine XR (EFFEXOR-XR) 150 MG 24 hr  capsule Take 150 mg by mouth daily with breakfast.    [provider]    Allergies Benadryl [diphenhydramine], Penicillins, and Sulfa antibiotics  Family History  Problem Relation Age of Onset  . Breast cancer Neg Hx     Social History Social History   Tobacco Use  . Smoking status: Never Smoker  . Smokeless tobacco: Never Used  Substance Use Topics  . Alcohol use: No  . Drug use: No    Review of Systems Constitutional: No fever/chills Cardiovascular: Denies chest pain. Respiratory: Denies shortness of breath. Gastrointestinal:  No nausea, no vomiting. Musculoskeletal: Bilateral hand pain. Skin: Positive  multiple puncture wounds. Neurological: Negative for headaches, focal weakness or numbness. ____________________________________________   PHYSICAL EXAM:  VITAL SIGNS: ED Triage Vitals  Enc Vitals Group     BP 04/23/19 0649 (!) 114/101     Pulse Rate 04/23/19 0649 84     Resp 04/23/19 0649 (!) 22     Temp 04/23/19 0649 98.5 F (36.9 C)     Temp Source 04/23/19 0649 Oral     SpO2 04/23/19 0649 95 %     Weight 04/23/19 0650 200 lb (90.7 kg)     Height 04/23/19 0650 5\' 8"  (1.727 m)     Head Circumference --      Peak Flow --      Pain Score 04/23/19 0650 8     Pain Loc --      Pain Edu? --      Excl. in [place]? --     Constitutional: Alert and oriented. Well appearing and in no acute distress. Eyes: Conjunctivae are normal.  Head: Atraumatic. Neck: No stridor.   Cardiovascular: Normal rate, regular rhythm. Grossly normal heart sounds.  Good peripheral circulation. Respiratory: Normal respiratory effort.  No retractions. Lungs CTAB. Musculoskeletal: Bilateral hands have multiple punctures various sizes dorsal and volar aspect.  Patient is able to flex and extend all digits without any difficulty.  There is minimal bleeding but no active bleeding present.  Capillary refill is less than 3 seconds.  Wrist bilaterally are also affected.  No difficulty with range of motion. Neurologic:  Normal speech and language. No gross focal neurologic deficits are appreciated. No gait instability. Skin:  Skin is warm, dry and intact. No rash noted.  No puncture wounds are noted to the face or throat. Psychiatric: Mood and affect are normal. Speech and behavior are normal.  ____________________________________________   LABS (all labs ordered are listed, but only abnormal results are displayed)  Labs Reviewed - No data to display  RADIOLOGY   Official radiology report(s): Dg Hand Complete Left  Result Date: 04/23/2019 CLINICAL DATA:  Multiple animal bite to both hands. EXAM: LEFT HAND -  COMPLETE 3+ VIEW COMPARISON:  None. FINDINGS: Examination demonstrates minimal degenerate change over the radiocarpal joint and first carpometacarpal joint. Mild degenerate change over the first interphalangeal joint. No evidence of fracture or dislocation. No radiopaque foreign body. No air in the soft tissues. IMPRESSION: No acute findings. Mild degenerative changes as described. Electronically Signed   By: Marin Olp M.D.   On: 04/23/2019 09:09   Dg Hand Complete Right  Result Date: 04/23/2019 CLINICAL DATA:  Patient with multiple animal bite. EXAM: RIGHT HAND - COMPLETE 3+ VIEW COMPARISON:  None. FINDINGS: Normal anatomic alignment. No evidence for acute fracture or dislocation. No radiopaque foreign body. IMPRESSION: No acute osseous abnormality. No radiopaque foreign body. Electronically Signed   By: Lovey Newcomer M.D.   On:  04/23/2019 09:10    ____________________________________________   PROCEDURES  Procedure(s) performed (including Critical Care):  Procedures   ____________________________________________   INITIAL IMPRESSION / ASSESSMENT AND PLAN / ED COURSE  As part of my medical decision making, I reviewed the following data within the electronic MEDICAL RECORD NUMBER Notes from prior ED visits and [place] Controlled Substance Database  68 year old female presents to the ED after being attacked by a unknown animal that she tried to pick up while she was walking very early this morning.  She states that she thought that the object was a walk and she tried to pick it up when it attacked her.  Patient describes the size of this animal and it is felt not to be a cat but possibly either a possum or raccoon.  [place] PD was present to take a report.  Patient's tetanus was updated.  She was given a prescription for Keflex for infection prevention.  She was instructed to clean these areas daily with mild soap and water.  She was also given a prescription for pain medication.  Rabies vaccine  was initiated and she was given the dates that she will need to return for the remaining injections.  X-rays were reassuring that there was no foreign body or acute bony injury.  Patient was told to return to the emergency department if any severe worsening of her symptoms.   ____________________________________________   FINAL CLINICAL IMPRESSION(S) / ED DIAGNOSES  Final diagnoses:  Animal bite  Need for post exposure prophylaxis for rabies     ED Discharge Orders         Ordered    cephALEXin (KEFLEX) 500 MG capsule  3 times daily     04/23/19 0938    HYDROcodone-acetaminophen (NORCO/VICODIN) 5-325 MG tablet  Every 6 hours PRN     04/23/19 3790           Note:  This document was prepared using Dragon voice recognition software and may include unintentional dictation errors.    Tommi Rumps, PA-C 04/23/19 1458    Arnaldo Natal, MD 04/23/19 1745

## 2019-04-23 NOTE — ED Triage Notes (Signed)
ADDENDUM: Patient has multiple puncture wounds to both hands.

## 2019-04-28 ENCOUNTER — Emergency Department
Admission: EM | Admit: 2019-04-28 | Discharge: 2019-04-28 | Disposition: A | Discharge status: Home / Self Care | Payer: Medicare Other | Attending: Emergency Medicine | Admitting: Emergency Medicine

## 2019-04-28 ENCOUNTER — Other Ambulatory Visit: Payer: Self-pay

## 2019-04-28 ENCOUNTER — Encounter: Payer: Self-pay | Admitting: Emergency Medicine

## 2019-04-28 DIAGNOSIS — Z7984 Long term (current) use of oral hypoglycemic drugs: Secondary | ICD-10-CM | POA: Diagnosis not present

## 2019-04-28 DIAGNOSIS — S61451A Open bite of right hand, initial encounter: Secondary | ICD-10-CM | POA: Diagnosis not present

## 2019-04-28 DIAGNOSIS — Z23 Encounter for immunization: Secondary | ICD-10-CM | POA: Insufficient documentation

## 2019-04-28 DIAGNOSIS — E039 Hypothyroidism, unspecified: Secondary | ICD-10-CM | POA: Diagnosis not present

## 2019-04-28 DIAGNOSIS — J45909 Unspecified asthma, uncomplicated: Secondary | ICD-10-CM | POA: Diagnosis not present

## 2019-04-28 DIAGNOSIS — S61452A Open bite of left hand, initial encounter: Secondary | ICD-10-CM | POA: Diagnosis not present

## 2019-04-28 DIAGNOSIS — Z203 Contact with and (suspected) exposure to rabies: Secondary | ICD-10-CM | POA: Diagnosis not present

## 2019-04-28 DIAGNOSIS — W5581XA Bitten by other mammals, initial encounter: Secondary | ICD-10-CM | POA: Insufficient documentation

## 2019-04-28 DIAGNOSIS — Z79899 Other long term (current) drug therapy: Secondary | ICD-10-CM | POA: Insufficient documentation

## 2019-04-28 DIAGNOSIS — E119 Type 2 diabetes mellitus without complications: Secondary | ICD-10-CM | POA: Insufficient documentation

## 2019-04-28 MED ORDER — RABIES VACCINE, PCEC IM SUSR
1.0000 mL | Freq: Once | INTRAMUSCULAR | Status: AC
  Administered 2019-04-28: 1 mL via INTRAMUSCULAR
  Filled 2019-04-28: qty 1

## 2019-04-28 NOTE — Discharge Instructions (Addendum)
Your rabies series is out of sync  since you missed an appointment.  Return on May 02, 2019 to continue series.

## 2019-04-28 NOTE — ED Provider Notes (Signed)
[facility] Emergency Department Provider Note   ____________________________________________   First MD Initiated Contact with Patient 04/28/19 1346     (approximate)  I have reviewed the triage vital signs and the nursing notes.   HISTORY  Chief Complaint Rabies Injection    HPI Brooke Leon is a 68 y.o. female patient returns for second rabies injection.  Patient is 2 days late from schedule injection.  Patient states she has transportation problems and cannot make it on her scheduled date.  Patient denies any reaction from the previous injections.  Patient voices concern about continued swelling to the third digit right hand.  Patient is taking antibiotics as directed.  Patient denies fever or pain.       Past Medical History:  Diagnosis Date  . Anemia   . Anxiety   . Asthma   . Barrett esophagus   . Bipolar 1 disorder (HCC)   . Colon polyp   . Depression   . Diabetes mellitus without complication (HCC)   . GERD (gastroesophageal reflux disease)   . Thyroid disease     There are no active problems to display for this patient.   Past Surgical History:  Procedure Laterality Date  . CHOLECYSTECTOMY     1998  . COLONOSCOPY WITH ESOPHAGOGASTRODUODENOSCOPY (EGD)    . COLONOSCOPY WITH PROPOFOL N/A 09/03/2016   Procedure: COLONOSCOPY WITH PROPOFOL;  Surgeon: Christena Deem, MD;  Location: [facility] ENDOSCOPY;  Service: Endoscopy;  Laterality: N/A;  . ESOPHAGOGASTRODUODENOSCOPY (EGD) WITH PROPOFOL N/A 09/03/2016   Procedure: ESOPHAGOGASTRODUODENOSCOPY (EGD) WITH PROPOFOL;  Surgeon: Christena Deem, MD;  Location: [facility] ENDOSCOPY;  Service: Endoscopy;  Laterality: N/A;    Prior to Admission medications   Medication Sig Start Date End Date Taking? Authorizing Provider  ALPRAZolam Prudy Feeler) 1 MG tablet Take 1 mg by mouth at bedtime as needed for anxiety.    [provider]  brimonidine (ALPHAGAN) 0.2 % ophthalmic solution Place 1 drop  into the right eye 3 (three) times daily. 02/24/18   Cuthriell, Delorise Royals, PA-C  cephALEXin (KEFLEX) 500 MG capsule Take 1 capsule (500 mg total) by mouth 3 (three) times daily. 04/23/19   Tommi Rumps, PA-C  EPINEPHrine (EPIPEN 2-PAK) 0.3 mg/0.3 mL IJ SOAJ injection Inject into the muscle once.    [provider]  HYDROcodone-acetaminophen (NORCO/VICODIN) 5-325 MG tablet Take 1 tablet by mouth every 6 (six) hours as needed for moderate pain. 04/23/19   Tommi Rumps, PA-C  lamoTRIgine (LAMICTAL) 100 MG tablet Take 100 mg by mouth daily.    [provider]  levothyroxine (SYNTHROID, LEVOTHROID) 137 MCG tablet Take 137 mcg by mouth daily before breakfast.    [provider]  metFORMIN (GLUCOPHAGE) 500 MG tablet Take 500 mg by mouth 2 (two) times daily with a meal.    [provider]  omeprazole (PRILOSEC) 40 MG capsule Take 40 mg by mouth daily.    [provider]  simvastatin (ZOCOR) 10 MG tablet Take 10 mg by mouth daily.    [provider]  venlafaxine XR (EFFEXOR-XR) 150 MG 24 hr capsule Take 150 mg by mouth daily with breakfast.    [provider]    Allergies Benadryl [diphenhydramine], Penicillins, and Sulfa antibiotics  Family History  Problem Relation Age of Onset  . Breast cancer Neg Hx     Social History Social History   Tobacco Use  . Smoking status: Never Smoker  . Smokeless tobacco: Never Used  Substance  Use Topics  . Alcohol use: No  . Drug use: No    Review of Systems  Constitutional: No fever/chills Eyes: No visual changes. ENT: No sore throat. Cardiovascular: Denies chest pain. Respiratory: Denies shortness of breath. Gastrointestinal: No abdominal pain.  No nausea, no vomiting.  No diarrhea.  No constipation. Genitourinary: Negative for dysuria. Musculoskeletal: Negative for back pain. Skin: Negative for rash. Neurological: Negative for headaches, focal weakness or numbness. Psychiatric:   Anxiety, bipolar, depression. Endocrine:  Diabetes and hypothyroidism. Hematological/Lymphatic:  Allergic/Immunilogical: Benadryl, penicillin, sulfur antibiotics. ____________________________________________   PHYSICAL EXAM:  VITAL SIGNS: ED Triage Vitals  Enc Vitals Group     BP 04/28/19 1309 130/77     Pulse Rate 04/28/19 1309 68     Resp 04/28/19 1309 16     Temp 04/28/19 1309 98.5 F (36.9 C)     Temp Source 04/28/19 1309 Oral     SpO2 04/28/19 1309 98 %     Weight 04/28/19 1303 199 lb (90.3 kg)     Height 04/28/19 1303 5\' 9"  (1.753 m)     Head Circumference --      Peak Flow --      Pain Score 04/28/19 1302 0     Pain Loc --      Pain Edu? --      Excl. in [place]? --     Constitutional: Alert and oriented. Well appearing and in no acute distress. Hematological/Lymphatic/Immunilogical: No cervical lymphadenopathy. Cardiovascular: Normal rate, regular rhythm. Grossly normal heart sounds.  Good peripheral circulation. Respiratory: Normal respiratory effort.  No retractions. Lungs CTAB. Gastrointestinal: Soft and nontender. No distention. No abdominal bruits. No CVA tenderness. Neurologic:  Normal speech and language. No gross focal neurologic deficits are appreciated. No gait instability. Skin: Bilateral hands with multiple bites and scratches.  Edema to the third digit right hand. Psychiatric: Mood and affect are normal. Speech and behavior are normal.  ____________________________________________   LABS (all labs ordered are listed, but only abnormal results are displayed)  Labs Reviewed - No data to display ____________________________________________  EKG   ____________________________________________  RADIOLOGY  ED MD interpretation:    Official radiology report(s): No results found.  ____________________________________________   PROCEDURES  Procedure(s) performed (including Critical Care):  Procedures    ____________________________________________   INITIAL IMPRESSION / ASSESSMENT AND PLAN / ED COURSE  As part of my medical decision making, I reviewed the following data within the [place] was evaluated in Emergency Department on 04/28/2019 for the symptoms described in the history of present illness. She was evaluated in the context of the global COVID-19 pandemic, which necessitated consideration that the patient might be at risk for infection with the SARS-CoV-2 virus that causes COVID-19. Institutional protocols and algorithms that pertain to the evaluation of patients at risk for COVID-19 are in a state of rapid change based on information released by regulatory bodies including the CDC and federal and state organizations. These policies and algorithms were followed during the patient's care in the ED.  Patient presents for second rabies shot.  Patient is scheduled series.  Patient says to be recalculated.  Patient given shot and advised on wound care.  Patient states she will [facility] with [facility] to complete the series.      ____________________________________________   FINAL CLINICAL IMPRESSION(S) / ED DIAGNOSES  Final diagnoses:  Contact with and (suspected) exposure to rabies  ED Discharge Orders    None       Note:  This document was prepared using Dragon voice recognition software and may include unintentional dictation errors.    Joni ReiningSmith, Alaiza Yau K, PA-C 04/28/19 1543    Sharyn CreamerQuale, Mark, MD 04/28/19 1743

## 2019-04-28 NOTE — ED Triage Notes (Signed)
Pt here for second rabies injection 

## 2019-05-02 ENCOUNTER — Other Ambulatory Visit: Payer: Self-pay

## 2019-05-02 ENCOUNTER — Ambulatory Visit
Admission: EM | Admit: 2019-05-02 | Discharge: 2019-05-02 | Disposition: A | Discharge status: Home / Self Care | Payer: Medicare Other | Attending: Family Medicine | Admitting: Family Medicine

## 2019-05-02 DIAGNOSIS — Z203 Contact with and (suspected) exposure to rabies: Secondary | ICD-10-CM | POA: Diagnosis not present

## 2019-05-02 DIAGNOSIS — Z23 Encounter for immunization: Secondary | ICD-10-CM | POA: Diagnosis not present

## 2019-05-02 DIAGNOSIS — T148XXA Other injury of unspecified body region, initial encounter: Secondary | ICD-10-CM

## 2019-05-02 DIAGNOSIS — L089 Local infection of the skin and subcutaneous tissue, unspecified: Secondary | ICD-10-CM

## 2019-05-02 MED ORDER — RABIES VACCINE, PCEC IM SUSR
1.0000 mL | Freq: Once | INTRAMUSCULAR | Status: AC
  Administered 2019-05-02: 15:00:00 1 mL via INTRAMUSCULAR

## 2019-05-02 MED ORDER — DOXYCYCLINE HYCLATE 100 MG PO CAPS: 100.0000 mg | ORAL_CAPSULE | Freq: Two times a day (BID) | ORAL | 0 refills | Status: DC

## 2019-05-02 MED ORDER — METRONIDAZOLE 500 MG PO TABS: 500.0000 mg | ORAL_TABLET | Freq: Three times a day (TID) | ORAL | 0 refills | Status: AC

## 2019-05-02 NOTE — Discharge Instructions (Signed)
Keep wounds clean.  Return in 7 days for last dose of Rabies vaccine.  Antibiotics as prescribed.  Take care  Dr. Lacinda Axon

## 2019-05-02 NOTE — ED Triage Notes (Addendum)
Pt states that she is here for second injection of rabies shot. In charting, appears that this will be patient's third rabies injection.  Pt requesting that she is evaluated by a provider for wound check to her bilateral hand wounds from initial animal attack. Evaluated at [facility] initially.

## 2019-05-02 NOTE — ED Provider Notes (Signed)
[facility]    CSN: [id number] Arrival date & time: 05/02/19  1417  History   Chief Complaint Chief Complaint  Patient presents with  . Rabies Injection   HPI  68 year old female presents for a wound check and for additional rabies vaccination.  Patient suffered multiple bites/puncture wounds on 11/8 (Hands). Patient is unsure of the animal that attacked her.  This is reported similarly in the encounter from 11/8.  She was placed on Keflex and was given rabies vaccine.  Patient was seen on 11/13 for second dose.  This was on day 5.  Patient presents today for her third rabies injection.  Patient also desires her wounds to be evaluated.  She continues to have pain of the hands where she suffered the abrasions.  She has significant swelling of the right middle finger.  She is concerned about infection in the setting of her recent punctures.  No fever.  No drainage.  Pain is currently 7/10 in severity.  No other associated symptoms.  No other complaints.  PMH, Surgical Hx, Family Hx, Social History reviewed and updated as below.  Past Medical History:  Diagnosis Date  . Anemia   . Anxiety   . Asthma   . Barrett esophagus   . Bipolar 1 disorder ([place])   . Colon polyp   . Depression   . Diabetes mellitus without complication ([place])   . GERD (gastroesophageal reflux disease)   . Thyroid disease    Past Surgical History:  Procedure Laterality Date  . CHOLECYSTECTOMY     1998  . COLONOSCOPY WITH ESOPHAGOGASTRODUODENOSCOPY (EGD)    . COLONOSCOPY WITH PROPOFOL N/A 09/03/2016   Procedure: COLONOSCOPY WITH PROPOFOL;  Surgeon: Lollie Sails, MD;  Location: [facility] ENDOSCOPY;  Service: Endoscopy;  Laterality: N/A;  . ESOPHAGOGASTRODUODENOSCOPY (EGD) WITH PROPOFOL N/A 09/03/2016   Procedure: ESOPHAGOGASTRODUODENOSCOPY (EGD) WITH PROPOFOL;  Surgeon: Lollie Sails, MD;  Location: [facility] ENDOSCOPY;  Service: Endoscopy;  Laterality: N/A;    OB History   No obstetric history on  file.      Home Medications    Prior to Admission medications   Medication Sig Start Date End Date Taking? Authorizing Provider  ALPRAZolam Duanne Moron) 1 MG tablet Take 1 mg by mouth at bedtime as needed for anxiety.   Yes [provider]  HYDROcodone-acetaminophen (NORCO/VICODIN) 5-325 MG tablet Take 1 tablet by mouth every 6 (six) hours as needed for moderate pain. 04/23/19  Yes Johnn Hai, PA-C  lamoTRIgine (LAMICTAL) 100 MG tablet Take 100 mg by mouth daily.   Yes [provider]  levothyroxine (SYNTHROID, LEVOTHROID) 137 MCG tablet Take 137 mcg by mouth daily before breakfast.   Yes [provider]  metFORMIN (GLUCOPHAGE) 500 MG tablet Take 500 mg by mouth 2 (two) times daily with a meal.   Yes [provider]  omeprazole (PRILOSEC) 40 MG capsule Take 40 mg by mouth daily.   Yes [provider]  simvastatin (ZOCOR) 10 MG tablet Take 10 mg by mouth daily.   Yes [provider]  venlafaxine XR (EFFEXOR-XR) 150 MG 24 hr capsule Take 150 mg by mouth daily with breakfast.   Yes [provider]  brimonidine (ALPHAGAN) 0.2 % ophthalmic solution Place 1 drop into the right eye 3 (three) times daily. 02/24/18   Cuthriell, Charline Bills, PA-C  doxycycline (VIBRAMYCIN) 100 MG capsule Take 1 capsule (100 mg total) by mouth 2 (two) times daily. 05/02/19   Coral Spikes, DO  EPINEPHrine (EPIPEN 2-PAK)  0.3 mg/0.3 mL IJ SOAJ injection Inject into the muscle once.    [provider]  metroNIDAZOLE (FLAGYL) 500 MG tablet Take 1 tablet (500 mg total) by mouth 3 (three) times daily for 7 days. 05/02/19 05/09/19  Tommie Sams, DO    Family History Family History  Problem Relation Age of Onset  . Breast cancer Neg Hx     Social History Social History   Tobacco Use  . Smoking status: Never Smoker  . Smokeless tobacco: Never Used  Substance Use Topics  . Alcohol use: No  . Drug use: No     Allergies   Benadryl  [diphenhydramine], Penicillins, and Sulfa antibiotics   Review of Systems Review of Systems  Constitutional: Negative.   Skin: Positive for wound.   Physical Exam Triage Vital Signs ED Triage Vitals  Enc Vitals Group     BP 05/02/19 1435 132/75     Pulse Rate 05/02/19 1435 72     Resp 05/02/19 1435 18     Temp 05/02/19 1435 99 F (37.2 C)     Temp Source 05/02/19 1435 Oral     SpO2 05/02/19 1435 100 %     Weight 05/02/19 1436 198 lb 6.6 oz (90 kg)     Height 05/02/19 1436 5\' 9"  (1.753 m)     Head Circumference --      Peak Flow --      Pain Score 05/02/19 1436 7     Pain Loc --      Pain Edu? --      Excl. in [place]? --    Updated Vital Signs BP 132/75 (BP Location: Left Arm)   Pulse 72   Temp 99 F (37.2 C) (Oral)   Resp 18   Ht 5\' 9"  (1.753 m)   Wt 90 kg   SpO2 100%   BMI 29.30 kg/m   Visual Acuity Right Eye Distance:   Left Eye Distance:   Bilateral Distance:    Right Eye Near:   Left Eye Near:    Bilateral Near:     Physical Exam Constitutional:      General: She is not in acute distress.    Appearance: Normal appearance. She is not ill-appearing.  HENT:     Head: Normocephalic and atraumatic.  Eyes:     General:        Right eye: No discharge.        Left eye: No discharge.     Conjunctiva/sclera: Conjunctivae normal.  Pulmonary:     Effort: Pulmonary effort is normal. No respiratory distress.  Skin:    Comments: Left hand with multiple healing puncture wounds. No apparent drainage.  Appear to be healing well.  Right hand -multiple punctures noted.  Patient has significant swelling of the right middle finger at the PIP joint and extending proximally.  Mildly tender to palpation.  Mild erythema.  No drainage.  Neurological:     Mental Status: She is alert.  Psychiatric:        Mood and Affect: Mood normal.        Behavior: Behavior normal.    UC Treatments / Results  Labs (all labs ordered are listed, but only abnormal results are displayed)  Labs Reviewed - No data to display  EKG   Radiology No results found.  Procedures Procedures (including critical care time)  Medications Ordered in UC Medications  rabies vaccine (RABAVERT) injection 1 mL (1 mL Intramuscular Given 05/02/19 1456)  Initial Impression / Assessment and Plan / UC Course  I have reviewed the triage vital signs and the nursing notes.  Pertinent labs & imaging results that were available during my care of the patient were reviewed by me and considered in my medical decision making (see chart for details).    68 year old female presents for rabies vaccination as well as repeat examinations of her wounds.  Third dose of rabies vaccine given.  Patient is to return in 7 days for last dose.  Concern for infection given significant swelling and erythema of the right middle finger.  Patient has had x-rays which were negative.  Placing on doxycycline and Flagyl given penicillin allergy.  Final Clinical Impressions(s) / UC Diagnoses   Final diagnoses:  Animal bite  Wound infection     Discharge Instructions     Keep wounds clean.  Return in 7 days for last dose of Rabies vaccine.  Antibiotics as prescribed.  Take care  Dr. Adriana Simasook     ED Prescriptions    Medication Sig Dispense Auth. Provider   doxycycline (VIBRAMYCIN) 100 MG capsule Take 1 capsule (100 mg total) by mouth 2 (two) times daily. 14 capsule Mozell Hardacre G, DO   metroNIDAZOLE (FLAGYL) 500 MG tablet Take 1 tablet (500 mg total) by mouth 3 (three) times daily for 7 days. 21 tablet Everlene Otherook, Korbin Mapps G, DO     PDMP not reviewed this encounter.   Tommie SamsCook, Kollyn Lingafelter G, [place] 05/02/19 1612

## 2019-05-09 ENCOUNTER — Ambulatory Visit
Admission: EM | Admit: 2019-05-09 | Discharge: 2019-05-09 | Disposition: A | Discharge status: Home / Self Care | Payer: Medicare Other | Attending: Family Medicine | Admitting: Family Medicine

## 2019-05-09 ENCOUNTER — Encounter: Payer: Self-pay | Admitting: Emergency Medicine

## 2019-05-09 ENCOUNTER — Other Ambulatory Visit: Payer: Self-pay

## 2019-05-09 DIAGNOSIS — Z203 Contact with and (suspected) exposure to rabies: Secondary | ICD-10-CM

## 2019-05-09 DIAGNOSIS — Z23 Encounter for immunization: Secondary | ICD-10-CM

## 2019-05-09 MED ORDER — RABIES VACCINE, PCEC IM SUSR
1.0000 mL | Freq: Once | INTRAMUSCULAR | Status: AC
  Administered 2019-05-09: 16:00:00 1 mL via INTRAMUSCULAR

## 2019-05-09 NOTE — ED Triage Notes (Signed)
Pt present to [place] for her last dose of rabies vaccine. Pt tolerated previous vaccines well.

## 2019-09-21 ENCOUNTER — Other Ambulatory Visit: Payer: Self-pay | Admitting: Physical Medicine & Rehabilitation

## 2019-09-21 DIAGNOSIS — M5416 Radiculopathy, lumbar region: Secondary | ICD-10-CM

## 2019-10-05 ENCOUNTER — Other Ambulatory Visit: Payer: Self-pay

## 2019-10-05 ENCOUNTER — Ambulatory Visit
Admission: RE | Admit: 2019-10-05 | Discharge: 2019-10-05 | Disposition: A | Discharge status: Home / Self Care | Payer: Medicare Other | Source: Ambulatory Visit | Attending: Physical Medicine & Rehabilitation | Admitting: Physical Medicine & Rehabilitation

## 2019-10-05 DIAGNOSIS — M5416 Radiculopathy, lumbar region: Secondary | ICD-10-CM

## 2020-03-25 ENCOUNTER — Other Ambulatory Visit: Payer: Self-pay | Admitting: Family Medicine

## 2020-03-25 DIAGNOSIS — Z1231 Encounter for screening mammogram for malignant neoplasm of breast: Secondary | ICD-10-CM

## 2020-06-15 ENCOUNTER — Other Ambulatory Visit: Payer: Self-pay

## 2020-06-15 ENCOUNTER — Encounter: Payer: Self-pay | Admitting: Gynecology

## 2020-06-15 ENCOUNTER — Ambulatory Visit
Admission: EM | Admit: 2020-06-15 | Discharge: 2020-06-15 | Disposition: A | Discharge status: Home / Self Care | Payer: Medicare Other | Attending: Family Medicine | Admitting: Family Medicine

## 2020-06-15 DIAGNOSIS — L0291 Cutaneous abscess, unspecified: Secondary | ICD-10-CM | POA: Diagnosis present

## 2020-06-15 DIAGNOSIS — Z20822 Contact with and (suspected) exposure to covid-19: Secondary | ICD-10-CM | POA: Diagnosis not present

## 2020-06-15 DIAGNOSIS — W5501XA Bitten by cat, initial encounter: Secondary | ICD-10-CM | POA: Insufficient documentation

## 2020-06-15 MED ORDER — METRONIDAZOLE 500 MG PO TABS: 500.0000 mg | ORAL_TABLET | Freq: Three times a day (TID) | ORAL | 0 refills | Status: AC

## 2020-06-15 MED ORDER — MUPIROCIN 2 % EX OINT: 1.0000 "application " | TOPICAL_OINTMENT | Freq: Three times a day (TID) | CUTANEOUS | 0 refills | Status: AC

## 2020-06-15 MED ORDER — DOXYCYCLINE HYCLATE 100 MG PO CAPS: 100.0000 mg | ORAL_CAPSULE | Freq: Two times a day (BID) | ORAL | 0 refills | Status: DC

## 2020-06-15 NOTE — ED Triage Notes (Signed)
Patient c/o bitten by her cat x 3 days ago on right hand. Patient stated wanted to  be tested for covid.

## 2020-06-15 NOTE — ED Provider Notes (Signed)
[facility]    CSN: [id number] Arrival date & time: 06/15/20  2725      History   Chief Complaint Chief Complaint  Patient presents with  . Animal Bite   HPI  70 year old female presents with the above complaint.  Patient states that she was bitten by her cat on her right hand below the thumb on Thursday.  She states that the area is painful, red and swollen.  It is draining pus.  No relieving factors.  Pain 2/10 in severity.  Patient states that her sinuses have been stopped up as well.  She would like Covid testing today.  No fever.  No other associated symptoms.  No other complaints.  Past Medical History:  Diagnosis Date  . Anemia   . Anxiety   . Asthma   . Barrett esophagus   . Bipolar 1 disorder (HCC)   . Colon polyp   . Depression   . Diabetes mellitus without complication (HCC)   . GERD (gastroesophageal reflux disease)   . Thyroid disease     Past Surgical History:  Procedure Laterality Date  . CHOLECYSTECTOMY     1998  . COLONOSCOPY WITH ESOPHAGOGASTRODUODENOSCOPY (EGD)    . COLONOSCOPY WITH PROPOFOL N/A 09/03/2016   Procedure: COLONOSCOPY WITH PROPOFOL;  Surgeon: Christena Deem, MD;  Location: [facility] ENDOSCOPY;  Service: Endoscopy;  Laterality: N/A;  . ESOPHAGOGASTRODUODENOSCOPY (EGD) WITH PROPOFOL N/A 09/03/2016   Procedure: ESOPHAGOGASTRODUODENOSCOPY (EGD) WITH PROPOFOL;  Surgeon: Christena Deem, MD;  Location[facility] ENDOSCOPY;  Service: Endoscopy;  Laterality: N/A;    OB History   No obstetric history on file.      Home Medications    Prior to Admission medications   Medication Sig Start Date End Date Taking? Authorizing Provider  ALPRAZolam Prudy Feeler) 1 MG tablet Take 1 mg by mouth at bedtime as needed for anxiety.   Yes [provider]  brimonidine (ALPHAGAN) 0.2 % ophthalmic solution Place 1 drop into the right eye 3 (three) times daily. 02/24/18  Yes Cuthriell, Delorise Royals, PA-C  doxycycline (VIBRAMYCIN) 100 MG capsule Take 1  capsule (100 mg total) by mouth 2 (two) times daily. 06/15/20  Yes Dejai Schubach G, DO  EPINEPHrine 0.3 mg/0.3 mL IJ SOAJ injection Inject into the muscle once.   Yes [provider]  HYDROcodone-acetaminophen (NORCO/VICODIN) 5-325 MG tablet Take 1 tablet by mouth every 6 (six) hours as needed for moderate pain. 04/23/19  Yes Tommi Rumps, PA-C  lamoTRIgine (LAMICTAL) 100 MG tablet Take 100 mg by mouth daily.   Yes [provider]  levothyroxine (SYNTHROID, LEVOTHROID) 137 MCG tablet Take 137 mcg by mouth daily before breakfast.   Yes [provider]  metFORMIN (GLUCOPHAGE) 500 MG tablet Take 500 mg by mouth 2 (two) times daily with a meal.   Yes [provider]  metroNIDAZOLE (FLAGYL) 500 MG tablet Take 1 tablet (500 mg total) by mouth 3 (three) times daily for 10 days. 06/15/20 06/25/20 Yes Breylon Sherrow G, DO  mupirocin ointment (BACTROBAN) 2 % Apply 1 application topically 3 (three) times daily for 7 days. 06/15/20 06/22/20 Yes Jammi Morrissette G, DO  omeprazole (PRILOSEC) 40 MG capsule Take 40 mg by mouth daily.   Yes [provider]  simvastatin (ZOCOR) 10 MG tablet Take 10 mg by mouth daily.   Yes [provider]  venlafaxine XR (EFFEXOR-XR) 150 MG 24 hr capsule Take 150 mg by mouth daily with breakfast.   Yes [provider]  Family History Family History  Problem Relation Age of Onset  . Breast cancer Neg Hx     Social History Social History   Tobacco Use  . Smoking status: Never Smoker  . Smokeless tobacco: Never Used  Vaping Use  . Vaping Use: Never used  Substance Use Topics  . Alcohol use: No  . Drug use: No     Allergies   Benadryl [diphenhydramine], Penicillins, and Sulfa antibiotics   Review of Systems Review of Systems  HENT: Positive for congestion.   Skin: Positive for wound.   Physical Exam Triage Vital Signs ED Triage Vitals  Enc Vitals Group     BP 06/15/20 0912 126/70     Pulse Rate 06/15/20 0912  78     Resp 06/15/20 0912 16     Temp 06/15/20 0912 98.4 F (36.9 C)     Temp Source 06/15/20 0912 Oral     SpO2 06/15/20 0912 98 %     Weight 06/15/20 0911 206 lb (93.4 kg)     Height 06/15/20 0914 5\' 9"  (1.753 m)     Head Circumference --      Peak Flow --      Pain Score 06/15/20 0914 2     Pain Loc --      Pain Edu? --    Updated Vital Signs BP 126/70 (BP Location: Left Arm)   Pulse 78   Temp 98.4 F (36.9 C) (Oral)   Resp 16   Ht 5\' 9"  (1.753 m)   Wt 93.4 kg   SpO2 98%   BMI 30.42 kg/m   Visual Acuity Right Eye Distance:   Left Eye Distance:   Bilateral Distance:    Right Eye Near:   Left Eye Near:    Bilateral Near:     Physical Exam Vitals and nursing note reviewed.  Constitutional:      General: She is not in acute distress.    Appearance: Normal appearance. She is not ill-appearing.  HENT:     Head: Normocephalic and atraumatic.  Eyes:     General:        Right eye: No discharge.        Left eye: No discharge.     Conjunctiva/sclera: Conjunctivae normal.  Pulmonary:     Effort: Pulmonary effort is normal. No respiratory distress.  Skin:    Comments: Right hand with an open wound with purulent drainage.  Surrounding redness.  Tender to palpation.  Neurological:     Mental Status: She is alert.  Psychiatric:        Mood and Affect: Mood normal.        Behavior: Behavior normal.    UC Treatments / Results  Labs (all labs ordered are listed, but only abnormal results are displayed) Labs Reviewed  SARS CORONAVIRUS 2 (TAT 6-24 HRS)    EKG   Radiology No results found.  Procedures Procedures (including critical care time)  Medications Ordered in UC Medications - No data to display  Initial Impression / Assessment and Plan / UC Course  I have reviewed the triage vital signs and the nursing notes.  Pertinent labs & imaging results that were available during my care of the patient were reviewed by me and considered in my medical decision  making (see chart for details).    70 year old female presents with recent cat bite which is led to abscess and cellulitis.  Wound is currently draining.  Given allergies, placing on doxycycline and Flagyl.  Bactroban ointment as prescribed.  Final Clinical Impressions(s) / UC Diagnoses   Final diagnoses:  Cat bite, initial encounter  Abscess   Discharge Instructions   None    ED Prescriptions    Medication Sig Dispense Auth. Provider   doxycycline (VIBRAMYCIN) 100 MG capsule Take 1 capsule (100 mg total) by mouth 2 (two) times daily. 20 capsule Sadye Kiernan G, DO   metroNIDAZOLE (FLAGYL) 500 MG tablet Take 1 tablet (500 mg total) by mouth 3 (three) times daily for 10 days. 30 tablet Tomasz Steeves G, DO   mupirocin ointment (BACTROBAN) 2 % Apply 1 application topically 3 (three) times daily for 7 days. 30 g Tommie Sams, DO     PDMP not reviewed this encounter.   Tommie Sams, [place] 06/15/20 1035

## 2020-06-16 LAB — SARS CORONAVIRUS 2 (TAT 6-24 HRS): SARS Coronavirus 2: NEGATIVE

## 2020-06-18 ENCOUNTER — Other Ambulatory Visit: Payer: Self-pay

## 2020-06-18 ENCOUNTER — Ambulatory Visit
Admission: RE | Admit: 2020-06-18 | Discharge: 2020-06-18 | Disposition: A | Discharge status: Home / Self Care | Payer: Medicare Other | Source: Ambulatory Visit | Attending: Family Medicine | Admitting: Family Medicine

## 2020-06-18 DIAGNOSIS — Z1231 Encounter for screening mammogram for malignant neoplasm of breast: Secondary | ICD-10-CM | POA: Diagnosis present

## 2021-04-19 IMAGING — MG DIGITAL SCREENING BILAT W/ TOMO W/ CAD
6 of 10 series · 6 of 30 positions shown · non-contrast
Comparison: Previous exam(s).

CLINICAL DATA: Screening.

EXAM:
DIGITAL SCREENING BILATERAL MAMMOGRAM WITH TOMO AND CAD

[L XCCL synth-2D]
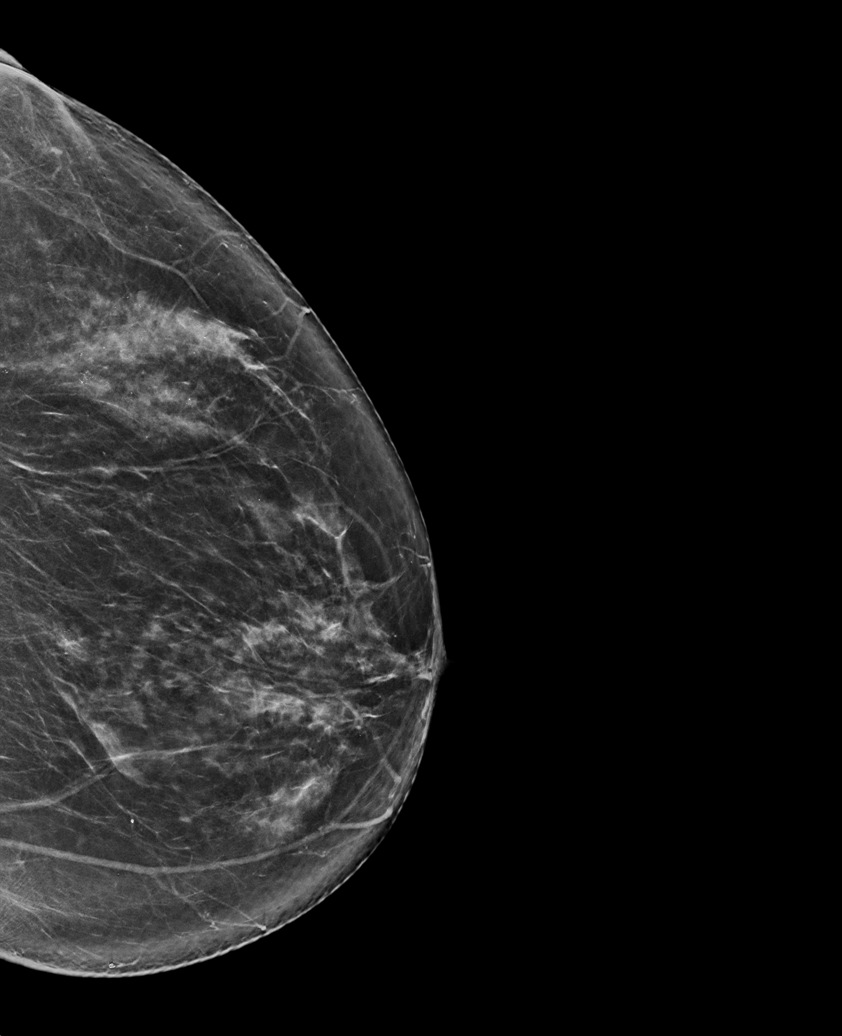

[R MLO synth-2D]
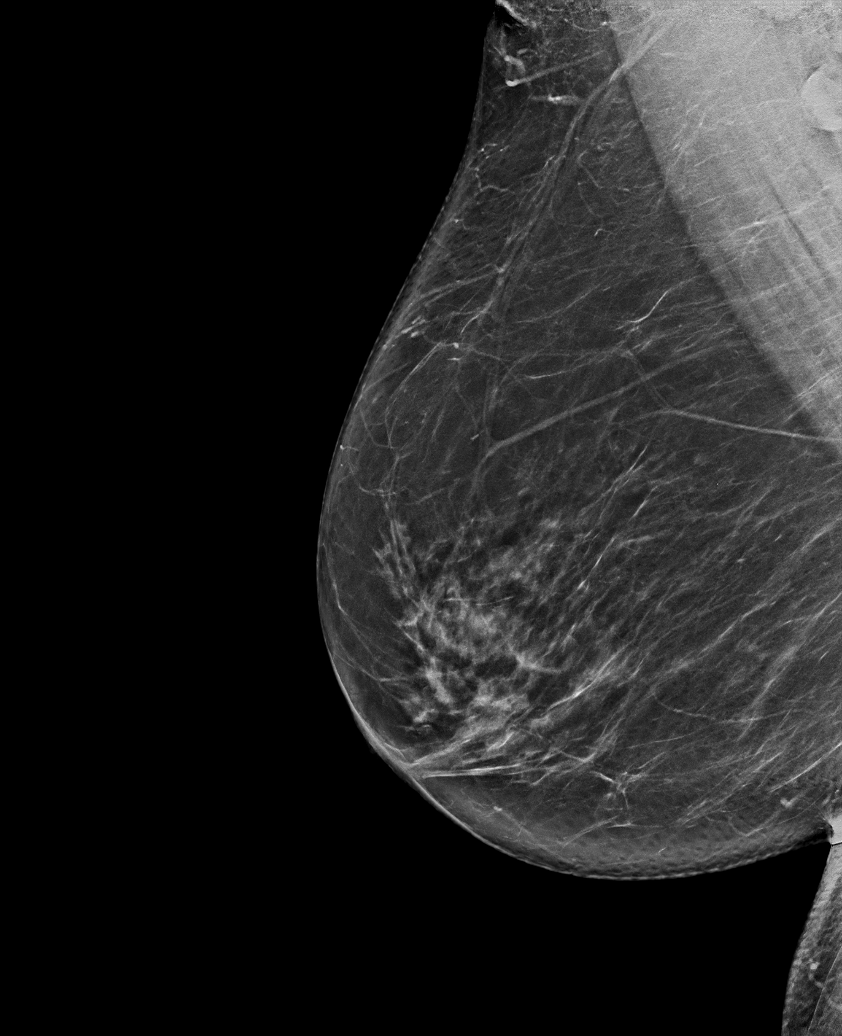

[L CC synth-2D]
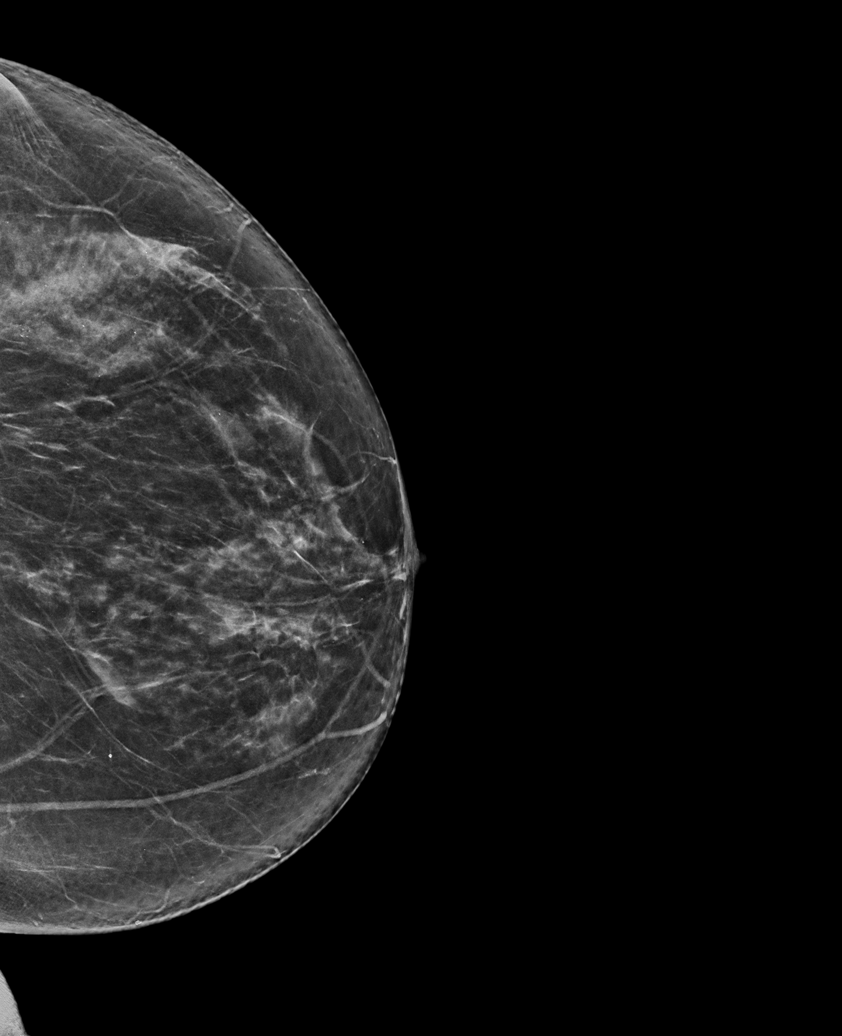

[R CC synth-2D]
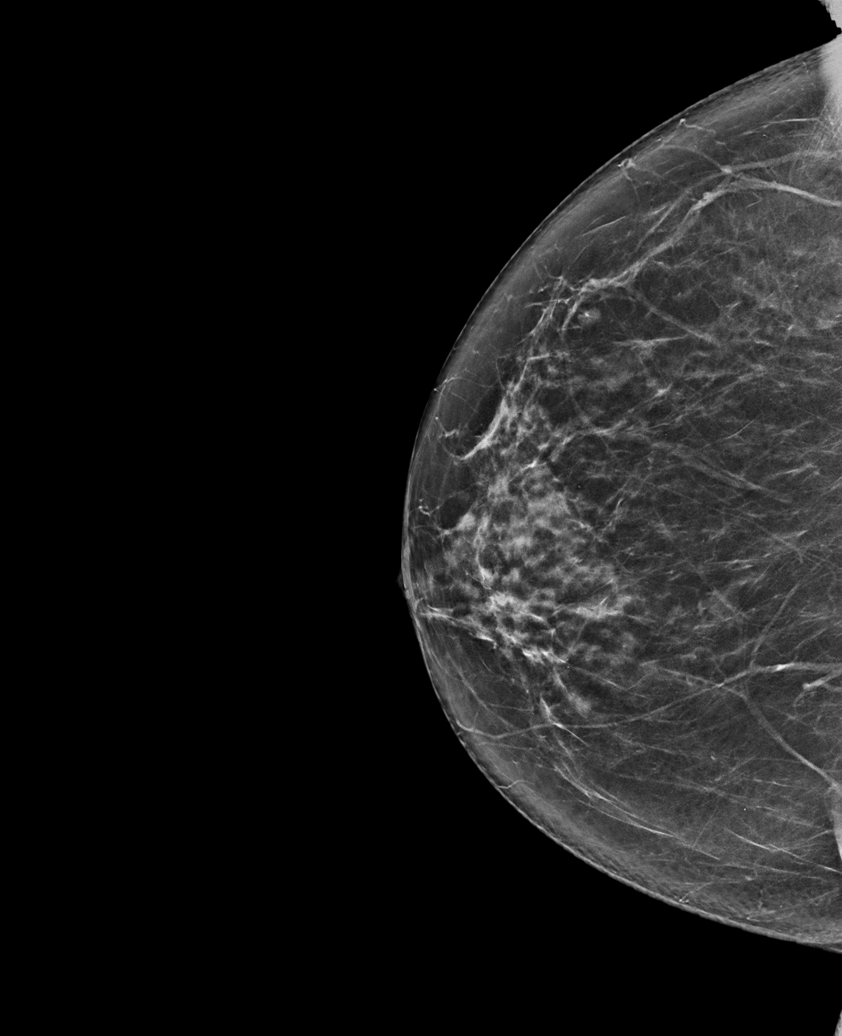

[L MLO synth-2D]
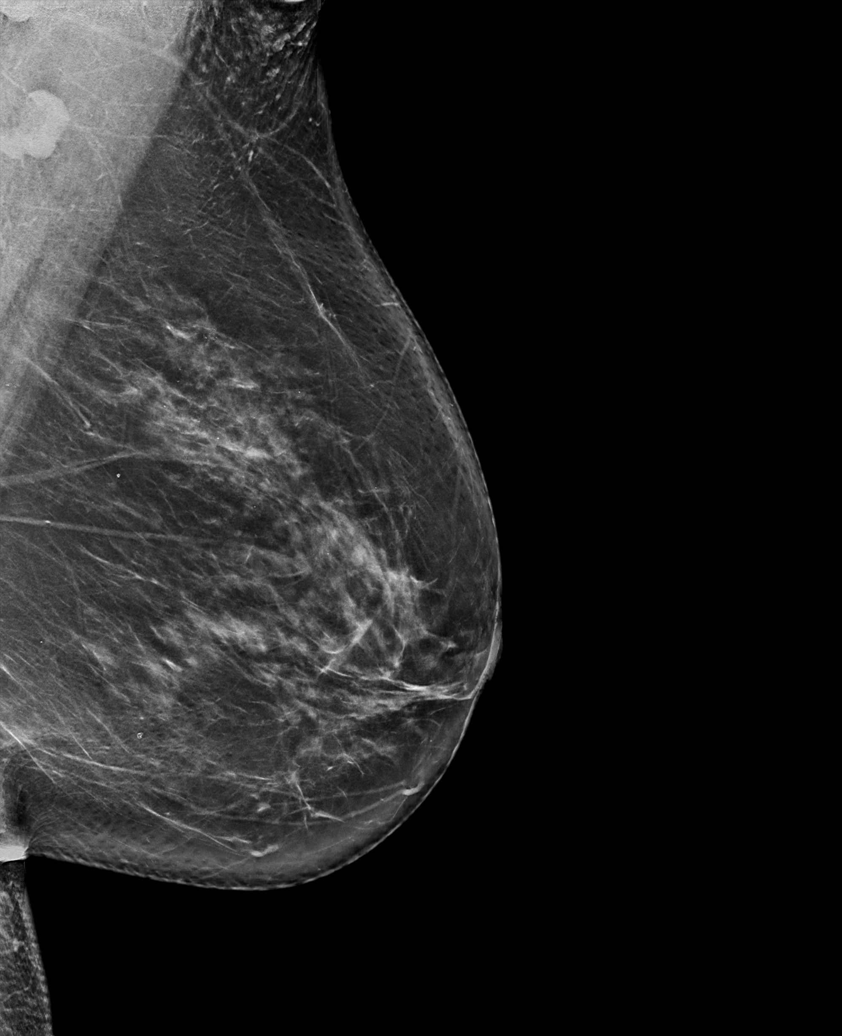

[R MLO tomo · tomo slice 42/83.0]
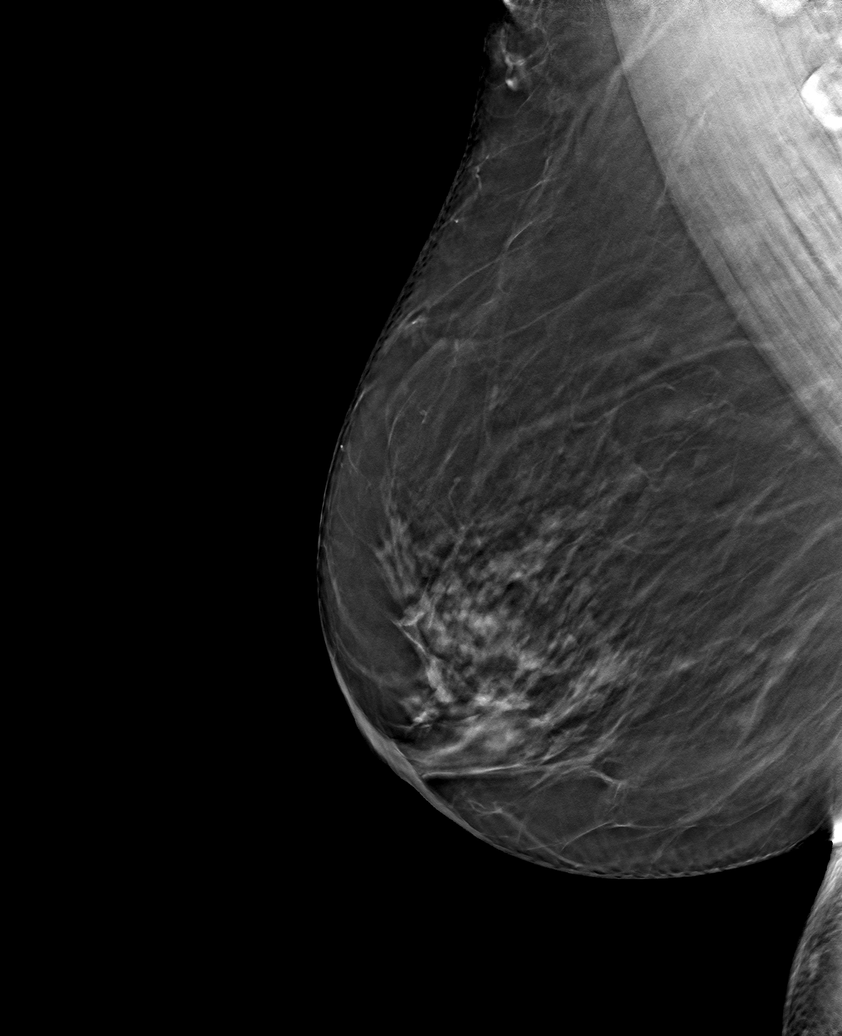

[6 of 30 positions shown; findings below may reference images not displayed]

ACR Breast Density Category c: The breast tissue is heterogeneously
dense, which may obscure small masses.
FINDINGS: There are no findings suspicious for malignancy. Images were
processed with CAD.
IMPRESSION: No mammographic evidence of malignancy. A result letter of this
screening mammogram will be mailed directly to the patient.

RECOMMENDATION:
Screening mammogram in one year. (Code:FT-U-LHB)

BI-RADS CATEGORY  1: Negative.

## 2021-09-18 ENCOUNTER — Other Ambulatory Visit: Payer: Self-pay | Admitting: Family Medicine

## 2021-09-18 DIAGNOSIS — Z1231 Encounter for screening mammogram for malignant neoplasm of breast: Secondary | ICD-10-CM

## 2022-01-07 ENCOUNTER — Ambulatory Visit
Admission: RE | Admit: 2022-01-07 | Discharge: 2022-01-07 | Disposition: A | Discharge status: Home / Self Care | Payer: Medicare Other | Source: Ambulatory Visit | Attending: Family Medicine | Admitting: Family Medicine

## 2022-01-07 DIAGNOSIS — Z1231 Encounter for screening mammogram for malignant neoplasm of breast: Secondary | ICD-10-CM | POA: Diagnosis present

## 2023-01-13 ENCOUNTER — Ambulatory Visit: Payer: Medicare Other

## 2023-01-13 DIAGNOSIS — K5730 Diverticulosis of large intestine without perforation or abscess without bleeding: Secondary | ICD-10-CM | POA: Diagnosis not present

## 2023-01-13 DIAGNOSIS — Z800 Family history of malignant neoplasm of digestive organs: Secondary | ICD-10-CM | POA: Diagnosis not present

## 2023-01-13 DIAGNOSIS — K449 Diaphragmatic hernia without obstruction or gangrene: Secondary | ICD-10-CM | POA: Diagnosis not present

## 2023-01-13 DIAGNOSIS — K31819 Angiodysplasia of stomach and duodenum without bleeding: Secondary | ICD-10-CM | POA: Diagnosis not present

## 2023-01-13 DIAGNOSIS — K2270 Barrett's esophagus without dysplasia: Secondary | ICD-10-CM | POA: Diagnosis not present

## 2023-01-13 DIAGNOSIS — Z1211 Encounter for screening for malignant neoplasm of colon: Secondary | ICD-10-CM | POA: Diagnosis present

## 2023-01-19 ENCOUNTER — Other Ambulatory Visit: Payer: Self-pay | Admitting: Family Medicine

## 2023-01-19 DIAGNOSIS — Z1231 Encounter for screening mammogram for malignant neoplasm of breast: Secondary | ICD-10-CM

## 2023-03-16 ENCOUNTER — Ambulatory Visit
Admission: RE | Admit: 2023-03-16 | Discharge: 2023-03-16 | Disposition: A | Discharge status: Home / Self Care | Payer: Medicare Other | Source: Ambulatory Visit | Attending: Family Medicine | Admitting: Family Medicine

## 2023-03-16 DIAGNOSIS — Z1231 Encounter for screening mammogram for malignant neoplasm of breast: Secondary | ICD-10-CM | POA: Diagnosis present

## 2023-05-19 ENCOUNTER — Other Ambulatory Visit: Payer: Self-pay

## 2023-05-19 ENCOUNTER — Encounter (HOSPITAL_COMMUNITY): Payer: Self-pay

## 2023-05-19 ENCOUNTER — Emergency Department (HOSPITAL_COMMUNITY): Payer: Medicare Other

## 2023-05-19 ENCOUNTER — Inpatient Hospital Stay (HOSPITAL_COMMUNITY)
Admission: EM | Admit: 2023-05-19 | Discharge: 2023-05-21 | DRG: 871 | Disposition: A | Discharge status: Home / Self Care | Payer: Medicare Other | Attending: Internal Medicine | Admitting: Internal Medicine

## 2023-05-19 DIAGNOSIS — F419 Anxiety disorder, unspecified: Secondary | ICD-10-CM | POA: Diagnosis present

## 2023-05-19 DIAGNOSIS — Z79899 Other long term (current) drug therapy: Secondary | ICD-10-CM

## 2023-05-19 DIAGNOSIS — Z882 Allergy status to sulfonamides status: Secondary | ICD-10-CM | POA: Diagnosis not present

## 2023-05-19 DIAGNOSIS — Z1152 Encounter for screening for COVID-19: Secondary | ICD-10-CM | POA: Diagnosis not present

## 2023-05-19 DIAGNOSIS — Z7984 Long term (current) use of oral hypoglycemic drugs: Secondary | ICD-10-CM

## 2023-05-19 DIAGNOSIS — K219 Gastro-esophageal reflux disease without esophagitis: Secondary | ICD-10-CM | POA: Diagnosis present

## 2023-05-19 DIAGNOSIS — R0602 Shortness of breath: Secondary | ICD-10-CM | POA: Diagnosis present

## 2023-05-19 DIAGNOSIS — E872 Acidosis, unspecified: Secondary | ICD-10-CM | POA: Diagnosis present

## 2023-05-19 DIAGNOSIS — J189 Pneumonia, unspecified organism: Secondary | ICD-10-CM | POA: Diagnosis present

## 2023-05-19 DIAGNOSIS — Z888 Allergy status to other drugs, medicaments and biological substances status: Secondary | ICD-10-CM

## 2023-05-19 DIAGNOSIS — E1165 Type 2 diabetes mellitus with hyperglycemia: Secondary | ICD-10-CM | POA: Diagnosis present

## 2023-05-19 DIAGNOSIS — Z7985 Long-term (current) use of injectable non-insulin antidiabetic drugs: Secondary | ICD-10-CM | POA: Diagnosis not present

## 2023-05-19 DIAGNOSIS — E1142 Type 2 diabetes mellitus with diabetic polyneuropathy: Secondary | ICD-10-CM | POA: Diagnosis present

## 2023-05-19 DIAGNOSIS — A419 Sepsis, unspecified organism: Principal | ICD-10-CM | POA: Diagnosis present

## 2023-05-19 DIAGNOSIS — Z79890 Hormone replacement therapy (postmenopausal): Secondary | ICD-10-CM

## 2023-05-19 DIAGNOSIS — E039 Hypothyroidism, unspecified: Secondary | ICD-10-CM | POA: Diagnosis present

## 2023-05-19 DIAGNOSIS — Z880 Allergy status to penicillin: Secondary | ICD-10-CM | POA: Diagnosis not present

## 2023-05-19 DIAGNOSIS — F319 Bipolar disorder, unspecified: Secondary | ICD-10-CM | POA: Diagnosis present

## 2023-05-19 DIAGNOSIS — E785 Hyperlipidemia, unspecified: Secondary | ICD-10-CM | POA: Diagnosis present

## 2023-05-19 DIAGNOSIS — M75102 Unspecified rotator cuff tear or rupture of left shoulder, not specified as traumatic: Secondary | ICD-10-CM | POA: Diagnosis present

## 2023-05-19 DIAGNOSIS — R059 Cough, unspecified: Principal | ICD-10-CM

## 2023-05-19 LAB — CBC WITH DIFFERENTIAL/PLATELET
Abs Immature Granulocytes: 0.07 10*3/uL (ref 0.00–0.07)
Basophils Absolute: 0 10*3/uL (ref 0.0–0.1)
Basophils Relative: 0 %
Eosinophils Absolute: 0 10*3/uL (ref 0.0–0.5)
Eosinophils Relative: 0 %
HCT: 38 % (ref 36.0–46.0)
Hemoglobin: 11.7 g/dL — ABNORMAL LOW (ref 12.0–15.0)
Immature Granulocytes: 1 %
Lymphocytes Relative: 5 %
Lymphs Abs: 0.6 10*3/uL — ABNORMAL LOW (ref 0.7–4.0)
MCH: 23.9 pg — ABNORMAL LOW (ref 26.0–34.0)
MCHC: 30.8 g/dL (ref 30.0–36.0)
MCV: 77.6 fL — ABNORMAL LOW (ref 80.0–100.0)
Monocytes Absolute: 0.9 10*3/uL (ref 0.1–1.0)
Monocytes Relative: 7 %
Neutro Abs: 11.5 10*3/uL — ABNORMAL HIGH (ref 1.7–7.7)
Neutrophils Relative %: 87 %
Platelets: 194 10*3/uL (ref 150–400)
RBC: 4.9 MIL/uL (ref 3.87–5.11)
RDW: 18.1 % — ABNORMAL HIGH (ref 11.5–15.5)
WBC: 13.1 10*3/uL — ABNORMAL HIGH (ref 4.0–10.5)
nRBC: 0 % (ref 0.0–0.2)

## 2023-05-19 LAB — I-STAT CG4 LACTIC ACID, ED
Lactic Acid, Venous: 4.4 mmol/L (ref 0.5–1.9)
Lactic Acid, Venous: 3.6 mmol/L (ref 0.5–1.9)
Lactic Acid, Venous: 4.4 mmol/L (ref 0.5–1.9)
Lactic Acid, Venous: 2.3 mmol/L (ref 0.5–1.9)

## 2023-05-19 LAB — URINALYSIS, W/ REFLEX TO CULTURE (INFECTION SUSPECTED)
Bacteria, UA: NONE SEEN
Bilirubin Urine: NEGATIVE
Glucose, UA: >=500 mg/dL — AB
Hgb urine dipstick: NEGATIVE
Ketones, ur: NEGATIVE mg/dL
Leukocytes,Ua: NEGATIVE
Nitrite: NEGATIVE
Protein, ur: NEGATIVE mg/dL
Specific Gravity, Urine: 1.016 (ref 1.005–1.030)
pH: 5 (ref 5.0–8.0)

## 2023-05-19 LAB — COMPREHENSIVE METABOLIC PANEL
ALT: 19 U/L (ref 0–44)
AST: 22 U/L (ref 15–41)
Albumin: 3.5 g/dL (ref 3.5–5.0)
Alkaline Phosphatase: 87 U/L (ref 38–126)
Anion gap: 11 (ref 5–15)
BUN: 15 mg/dL (ref 8–23)
CO2: 19 mmol/L — ABNORMAL LOW (ref 22–32)
Calcium: 8.7 mg/dL — ABNORMAL LOW (ref 8.9–10.3)
Chloride: 101 mmol/L (ref 98–111)
Creatinine, Ser: 0.96 mg/dL (ref 0.44–1.00)
GFR, Estimated: >60 mL/min (ref >60)
Glucose, Bld: 348 mg/dL — ABNORMAL HIGH (ref 70–99)
Potassium: 3.3 mmol/L — ABNORMAL LOW (ref 3.5–5.1)
Sodium: 131 mmol/L — ABNORMAL LOW (ref 135–145)
Total Bilirubin: 0.5 mg/dL (ref <1.2)
Total Protein: 6.9 g/dL (ref 6.5–8.1)

## 2023-05-19 LAB — RESP PANEL BY RT-PCR (RSV, FLU A&B, COVID)  RVPGX2
Influenza A by PCR: NEGATIVE
Influenza B by PCR: NEGATIVE
Resp Syncytial Virus by PCR: NEGATIVE
SARS Coronavirus 2 by RT PCR: NEGATIVE

## 2023-05-19 LAB — PROTIME-INR
INR: 1.1 (ref 0.8–1.2)
Prothrombin Time: 14.2 s (ref 11.4–15.2)

## 2023-05-19 LAB — TROPONIN I (HIGH SENSITIVITY): Troponin I (High Sensitivity): 18 ng/L — ABNORMAL HIGH (ref <18)

## 2023-05-19 LAB — CBG MONITORING, ED: Glucose-Capillary: 243 mg/dL — ABNORMAL HIGH (ref 70–99)

## 2023-05-19 LAB — TSH: TSH: 0.432 u[IU]/mL (ref 0.350–4.500)

## 2023-05-19 LAB — BRAIN NATRIURETIC PEPTIDE: B Natriuretic Peptide: 39.2 pg/mL (ref 0.0–100.0)

## 2023-05-19 MED ORDER — ACETAMINOPHEN 325 MG PO TABS
325.0000 mg | ORAL_TABLET | Freq: Once | ORAL | Status: AC
  Administered 2023-05-19: 325 mg via ORAL
  Filled 2023-05-19: qty 1

## 2023-05-19 MED ORDER — VENLAFAXINE HCL ER 150 MG PO CP24
150.0000 mg | ORAL_CAPSULE | Freq: Every day | ORAL | Status: DC
  Administered 2023-05-20 – 2023-05-21 (×2): 150 mg via ORAL
  Filled 2023-05-19: qty 1
  Filled 2023-05-19: qty 2

## 2023-05-19 MED ORDER — ENOXAPARIN SODIUM 40 MG/0.4ML IJ SOSY: 40.0000 mg | PREFILLED_SYRINGE | INTRAMUSCULAR | Status: DC

## 2023-05-19 MED ORDER — DICLOFENAC SODIUM 1 % EX GEL: 4.0000 g | Freq: Three times a day (TID) | CUTANEOUS | Status: DC | PRN

## 2023-05-19 MED ORDER — INSULIN ASPART 100 UNIT/ML IJ SOLN
0.0000 [IU] | Freq: Three times a day (TID) | INTRAMUSCULAR | Status: DC
  Administered 2023-05-19: 5 [IU] via SUBCUTANEOUS
  Administered 2023-05-20: 3 [IU] via SUBCUTANEOUS
  Administered 2023-05-20: 5 [IU] via SUBCUTANEOUS
  Administered 2023-05-20: 8 [IU] via SUBCUTANEOUS
  Administered 2023-05-21 (×2): 3 [IU] via SUBCUTANEOUS

## 2023-05-19 MED ORDER — GABAPENTIN 300 MG PO CAPS
300.0000 mg | ORAL_CAPSULE | Freq: Two times a day (BID) | ORAL | Status: DC
  Administered 2023-05-19 – 2023-05-21 (×5): 300 mg via ORAL
  Filled 2023-05-19 (×5): qty 1

## 2023-05-19 MED ORDER — SODIUM CHLORIDE 0.9 % IV BOLUS
1000.0000 mL | Freq: Once | INTRAVENOUS | Status: AC
  Administered 2023-05-19: 1000 mL via INTRAVENOUS

## 2023-05-19 MED ORDER — SODIUM CHLORIDE 0.9 % IV SOLN
500.0000 mg | INTRAVENOUS | Status: AC
  Administered 2023-05-20 – 2023-05-21 (×2): 500 mg via INTRAVENOUS
  Filled 2023-05-19 (×2): qty 5

## 2023-05-19 MED ORDER — LAMOTRIGINE 100 MG PO TABS
100.0000 mg | ORAL_TABLET | Freq: Two times a day (BID) | ORAL | Status: DC
Start: 2023-05-20 — End: 2023-05-21
  Administered 2023-05-20 – 2023-05-21 (×3): 100 mg via ORAL
  Filled 2023-05-19 (×2): qty 1
  Filled 2023-05-19: qty 4

## 2023-05-19 MED ORDER — CEFTRIAXONE SODIUM 1 G IJ SOLR
1.0000 g | Freq: Once | INTRAMUSCULAR | Status: AC
  Administered 2023-05-19: 1 g via INTRAVENOUS
  Filled 2023-05-19: qty 10

## 2023-05-19 MED ORDER — PANTOPRAZOLE SODIUM 40 MG PO TBEC
40.0000 mg | DELAYED_RELEASE_TABLET | Freq: Every day | ORAL | Status: DC
  Administered 2023-05-19 – 2023-05-21 (×3): 40 mg via ORAL
  Filled 2023-05-19 (×3): qty 1

## 2023-05-19 MED ORDER — ACETAMINOPHEN 325 MG PO TABS
650.0000 mg | ORAL_TABLET | Freq: Four times a day (QID) | ORAL | Status: DC | PRN
  Administered 2023-05-19 – 2023-05-21 (×2): 650 mg via ORAL
  Filled 2023-05-19 (×2): qty 2

## 2023-05-19 MED ORDER — LEVOTHYROXINE SODIUM 75 MCG PO TABS
150.0000 ug | ORAL_TABLET | Freq: Every day | ORAL | Status: DC
  Administered 2023-05-20 – 2023-05-21 (×2): 150 ug via ORAL
  Filled 2023-05-19 (×2): qty 2

## 2023-05-19 MED ORDER — SODIUM CHLORIDE 0.9 % IV SOLN
500.0000 mg | Freq: Once | INTRAVENOUS | Status: AC
  Administered 2023-05-19: 500 mg via INTRAVENOUS
  Filled 2023-05-19: qty 5

## 2023-05-19 MED ORDER — LIDOCAINE 5 % EX PTCH
1.0000 | MEDICATED_PATCH | CUTANEOUS | Status: DC
  Administered 2023-05-19: 1 via TRANSDERMAL
  Filled 2023-05-19: qty 1

## 2023-05-19 MED ORDER — SIMVASTATIN 20 MG PO TABS
20.0000 mg | ORAL_TABLET | Freq: Every day | ORAL | Status: DC
Start: 2023-05-19 — End: 2023-05-21
  Administered 2023-05-19 – 2023-05-20 (×2): 20 mg via ORAL
  Filled 2023-05-19 (×2): qty 1

## 2023-05-19 MED ORDER — LEVOTHYROXINE SODIUM 75 MCG PO TABS: 150.0000 ug | ORAL_TABLET | Freq: Every day | ORAL | Status: DC

## 2023-05-19 MED ORDER — IBUPROFEN 800 MG PO TABS
800.0000 mg | ORAL_TABLET | Freq: Once | ORAL | Status: AC
  Administered 2023-05-19: 800 mg via ORAL
  Filled 2023-05-19: qty 1

## 2023-05-19 MED ORDER — SODIUM CHLORIDE 0.9 % IV SOLN
1.0000 g | INTRAVENOUS | Status: DC
  Administered 2023-05-20 – 2023-05-21 (×2): 1 g via INTRAVENOUS
  Filled 2023-05-19 (×3): qty 10

## 2023-05-19 MED ORDER — ALPRAZOLAM 0.5 MG PO TABS
1.0000 mg | ORAL_TABLET | Freq: Every evening | ORAL | Status: DC | PRN
  Administered 2023-05-19: 1 mg via ORAL
  Filled 2023-05-19: qty 4

## 2023-05-19 MED ORDER — ACETAMINOPHEN 500 MG PO TABS: 1000.0000 mg | ORAL_TABLET | Freq: Once | ORAL | Status: DC

## 2023-05-19 MED ORDER — LAMOTRIGINE 25 MG PO TABS
100.0000 mg | ORAL_TABLET | Freq: Every day | ORAL | Status: DC
Start: 2023-05-19 — End: 2023-05-19
  Administered 2023-05-19: 100 mg via ORAL
  Filled 2023-05-19: qty 4

## 2023-05-19 MED ORDER — ALPRAZOLAM 0.25 MG PO TABS
1.0000 mg | ORAL_TABLET | Freq: Once | ORAL | Status: AC
  Administered 2023-05-19: 1 mg via ORAL
  Filled 2023-05-19: qty 4

## 2023-05-19 NOTE — Inpatient Diabetes Management (Signed)
Inpatient Diabetes Program Recommendations  AACE/ADA: New Consensus Statement on Inpatient Glycemic Control (2015)  Target Ranges:  Prepandial:   less than 140 mg/dL      Peak postprandial:   less than 180 mg/dL (1-2 hours)      Critically ill patients:  140 - 180 mg/dL   Lab Results  Component Value Date   GLUCAP 128 (H) 09/03/2016    Review of Glycemic Control  Latest Reference Range & Units 05/19/23 09:30  Glucose 70 - 99 mg/dL 161 (H)   Diabetes history: DM 2 Outpatient Diabetes medications: Glipizide 2.5 mg Daily, Metformin 500 mg bid, Ozempic 0.5 mg Weekly Current orders for Inpatient glycemic control:  None being evaluated in ED  Inpatient Diabetes Program Recommendations:    -   Consider Novolog 0-15 units Q4 hours while NPO  Thanks,  Christena Deem RN, MSN, BC-ADM Inpatient Diabetes Coordinator Team Pager [phone] (8a-5p)

## 2023-05-19 NOTE — H&P (Incomplete)
Date: 05/19/2023               Patient Name:  Brooke Leon MRN: [id number]  DOB: 20-Jul-1950 Age / Sex: 72 y.o., female   PCP: Jerl Mina, MD         Medical Service: Internal Medicine Teaching Service         Attending Physician: Dr. Cathren Laine, MD      First Contact: Dr. Laretta Bolster, MD Pager [phone]    Second Contact: Dr. Rana Snare, DO Pager [phone]         After Hours (After 5p/  First Contact Pager: [phone]  weekends / holidays): Second Contact Pager: [phone]   SUBJECTIVE   Chief Complaint: ***  History of Present Illness:   This morning started having trouble holding urine.   ED Course: Febrile to 103.61F  Glucose 348 LA 4.4 BNP wnl Trop 18  Given ibuprofen Started on ceftriaxone and azithromycin  RVP negative Blood cx sent  Past Medical History  Past Surgical History:  Procedure Laterality Date   CHOLECYSTECTOMY     1998   COLONOSCOPY WITH ESOPHAGOGASTRODUODENOSCOPY (EGD)     COLONOSCOPY WITH PROPOFOL N/A 09/03/2016   Procedure: COLONOSCOPY WITH PROPOFOL;  Surgeon: Christena Deem, MD;  Location: [facility] ENDOSCOPY;  Service: Endoscopy;  Laterality: N/A;   ESOPHAGOGASTRODUODENOSCOPY (EGD) WITH PROPOFOL N/A 09/03/2016   Procedure: ESOPHAGOGASTRODUODENOSCOPY (EGD) WITH PROPOFOL;  Surgeon: Christena Deem, MD;  Location: [facility] ENDOSCOPY;  Service: Endoscopy;  Laterality: N/A;    Meds:  Current Meds  Medication Sig   simvastatin (ZOCOR) 20 MG tablet Take 20 mg by mouth at bedtime.    Social:  Lives With: Occupation: Support: Level of Function: PCP: Substances:  Family History: ***  Allergies: Allergies as of 05/19/2023 - Review Complete 05/19/2023  Allergen Reaction Noted   Benadryl [diphenhydramine] Other (See Comments) 09/02/2016   Penicillins Other (See Comments) 09/02/2016   Sulfa antibiotics Other (See Comments) 09/02/2016    Review of Systems: A complete ROS was negative except as per HPI.   OBJECTIVE:   Physical  Exam: Blood pressure 129/89, pulse 92, temperature 98.7 F (37.1 C), temperature source Oral, resp. rate 18, height 5\' 8"  (1.727 m), weight 90.3 kg, SpO2 96%.  Constitutional: well-appearing *** sitting in ***, in no acute distress HENT: normocephalic atraumatic, mucous membranes moist Eyes: conjunctiva non-erythematous Neck: supple Cardiovascular: regular rate and rhythm, no m/r/g Pulmonary/Chest: normal work of breathing on room air, lungs clear to auscultation bilaterally Abdominal: soft, non-tender, non-distended MSK: normal bulk and tone Neurological: alert & oriented x 3, 5/5 strength in bilateral upper and lower extremities, normal gait Skin: warm and dry Psych: ***  Labs: CBC    Component Value Date/Time   WBC 13.1 (H) 05/19/2023 0930   RBC 4.90 05/19/2023 0930   HGB 11.7 (L) 05/19/2023 0930   HCT 38.0 05/19/2023 0930   PLT 194 05/19/2023 0930   MCV 77.6 (L) 05/19/2023 0930   MCH 23.9 (L) 05/19/2023 0930   MCHC 30.8 05/19/2023 0930   RDW 18.1 (H) 05/19/2023 0930   LYMPHSABS 0.6 (L) 05/19/2023 0930   MONOABS 0.9 05/19/2023 0930   EOSABS 0.0 05/19/2023 0930   BASOSABS 0.0 05/19/2023 0930     CMP     Component Value Date/Time   NA 131 (L) 05/19/2023 0930   K 3.3 (L) 05/19/2023 0930   CL 101 05/19/2023 0930   CO2 19 (L) 05/19/2023 0930   GLUCOSE 348 (H) 05/19/2023 0930   BUN 15  05/19/2023 0930   CREATININE 0.96 05/19/2023 0930   CALCIUM 8.7 (L) 05/19/2023 0930   PROT 6.9 05/19/2023 0930   ALBUMIN 3.5 05/19/2023 0930   AST 22 05/19/2023 0930   ALT 19 05/19/2023 0930   ALKPHOS 87 05/19/2023 0930   BILITOT 0.5 05/19/2023 0930   GFRNONAA >60 05/19/2023 0930    Imaging:  Left shoulder xray IMPRESSION: 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 No acute abnormality seen.  CXR IMPRESSION: Right basilar atelectasis or infiltrate is noted with possible associated effusion. Right upper lobe opacity is also noted concerning for  pneumonia, with another possible nodular density toward the apex. CT scan of the chest is recommended for further evaluation.  EKG: sinus tachycardia  ASSESSMENT & PLAN:   Assessment & Plan by Problem: Active Problems:   * No active hospital problems. *   Brooke Leon is a 72 y.o. person living with a history of *** who presented with *** and admitted for *** on hospital day 0  Lactic acidosis Leukocytosis  Hypokalemia  NAGMA  Microcytic anemia  Diet: {NAMES:3044014::"Normal","Heart Healthy","Carb-Modified","Renal","Carb/Renal","NPO","TPN","Tube Feeds"} VTE: {NAMES:3044014::"Heparin","Enoxaparin","SCDs","DOAC","None"} IVF: {NAMES:3044014::"None","NS","1/2 NS","LR","D5","D10"},{NAMES:3044014::"None","10cc/hr","25cc/hr","50cc/hr","75cc/hr","100cc/hr","110cc/hr","125cc/hr","Bolus"} Code: {NAMES:3044014::"Full","DNR","DNI","DNR/DNI","Comfort Care","Unknown"}  Prior to Admission Living Arrangement: {NAMES:3044014::"Home, living ***","SNF, ***","Homeless","***"} Anticipated Discharge Location: {NAMES:3044014::"Home","SNF","CIR","***"} Barriers to Discharge: ***  Dispo: Admit patient to {STATUS:3044014::"Observation with expected length of stay less than 2 midnights.","Inpatient with expected length of stay greater than 2 midnights."}  Signed: Rudene Christians, DO Internal Medicine Resident PGY-3  05/19/2023, 11:43 AM

## 2023-05-19 NOTE — ED Notes (Signed)
ED TO INPATIENT HANDOFF REPORT  ED Nurse Name and Phone #: Percival Spanish [phone]  S Name/Age/Gender Brooke Leon 72 y.o. female Room/Bed: 040C/040C  Code Status   Code Status: Full Code  Home/SNF/Other Home Patient oriented to: self, place, time, and situation Is this baseline? Yes   Triage Complete: Triage complete  Chief Complaint Community acquired pneumonia [J18.9]  Triage Note Pt BIB [facility] from home with SOB, generalized weakness, cough, and body aches since last night. Temp 101.8, 110 HR, and BP 116/64 per EMS, 92% on RA, 95% on 2L [place]. 650 tylenol PO given by EMS.    Allergies Allergies  Allergen Reactions   Benadryl [Diphenhydramine] Other (See Comments)    "Skin Crawling" sensation   Penicillins Other (See Comments)    ** Tolerates cephalosporins Told by parents that she had a reaction as a child   Sulfa Antibiotics Other (See Comments)    "Skin Crawling" sensation    Level of Care/Admitting Diagnosis ED Disposition     ED Disposition  Admit   Condition  --   Comment  Hospital [facility] [[id number]]  Level of Care: Med-Surg [16]  May admit patient to [facility] or [facility] if equivalent level of care is available:: No  Covid Evaluation: Confirmed COVID Negative  Diagnosis: Community acquired pneumonia [284132]  Admitting Physician: Mercie Eon [[id number]]  Attending Physician: Mercie Eon [[id number]]  Certification:: I certify this patient will need inpatient services for at least 2 midnights          B Medical/Surgery History Past Medical History:  Diagnosis Date   Anemia    Anxiety    Asthma    Barrett esophagus    Bipolar 1 disorder (HCC)    Colon polyp    Depression    Diabetes mellitus without complication (HCC)    GERD (gastroesophageal reflux disease)    Thyroid disease    Past Surgical History:  Procedure Laterality Date   CHOLECYSTECTOMY     1998   COLONOSCOPY WITH ESOPHAGOGASTRODUODENOSCOPY (EGD)      COLONOSCOPY WITH PROPOFOL N/A 09/03/2016   Procedure: COLONOSCOPY WITH PROPOFOL;  Surgeon: Christena Deem, MD;  Location: [facility] ENDOSCOPY;  Service: Endoscopy;  Laterality: N/A;   ESOPHAGOGASTRODUODENOSCOPY (EGD) WITH PROPOFOL N/A 09/03/2016   Procedure: ESOPHAGOGASTRODUODENOSCOPY (EGD) WITH PROPOFOL;  Surgeon: Christena Deem, MD;  Location: [facility] ENDOSCOPY;  Service: Endoscopy;  Laterality: N/A;     A IV Location/Drains/Wounds Patient Lines/Drains/Airways Status     Active Line/Drains/Airways     Name Placement date Placement time Site Days   Peripheral IV 05/19/23 18 G Left Antecubital 05/19/23  0347  Antecubital  less than 1   Peripheral IV 05/19/23 20 G Right Antecubital 05/19/23  4259  Antecubital  less than 1            Intake/Output Last 24 hours  Intake/Output Summary (Last 24 hours) at 05/19/2023 1411 Last data filed at 05/19/2023 1206 Gross per 24 hour  Intake 1250.62 ml  Output --  Net 1250.62 ml    Labs/Imaging Results for orders placed or performed during the hospital encounter of 05/19/23 (from the past 48 hour(s))  TSH     Status: None   Collection Time: 05/19/23  9:20 AM  Result Value Ref Range   TSH 0.432 0.350 - 4.500 uIU/mL    Comment: Performed by a 3rd Generation assay with a functional sensitivity of <=0.01 uIU/mL. Performed at [facility], [address], [place], [place]  [address]   Resp panel by RT-PCR (RSV, Flu A&B, Covid) Anterior Nasal Swab     Status: None   Collection Time: 05/19/23  9:24 AM   Specimen: Anterior Nasal Swab  Result Value Ref Range   SARS Coronavirus 2 by RT PCR NEGATIVE NEGATIVE   Influenza A by PCR NEGATIVE NEGATIVE   Influenza B by PCR NEGATIVE NEGATIVE    Comment: (NOTE) The Xpert Xpress SARS-CoV-2/FLU/RSV plus assay is intended as an aid in the diagnosis of influenza from Nasopharyngeal swab specimens and should not be used as a sole basis for treatment. Nasal washings and aspirates are unacceptable for Xpert  Xpress SARS-CoV-2/FLU/RSV testing.  Fact Sheet for Patients: BloggerCourse.com  Fact Sheet for Healthcare Providers: SeriousBroker.it  This test is not yet approved or cleared by the [place] FDA and has been authorized for detection and/or diagnosis of SARS-CoV-2 by FDA under an Emergency Use Authorization (EUA). This EUA will remain in effect (meaning this test can be used) for the duration of the COVID-19 declaration under Section 564(b)(1) of the Act, 21 U.S.C. section 360bbb-3(b)(1), unless the authorization is terminated or revoked.     Resp Syncytial Virus by PCR NEGATIVE NEGATIVE    Comment: (NOTE) Fact Sheet for Patients: BloggerCourse.com  Fact Sheet for Healthcare Providers: SeriousBroker.it  This test is not yet approved or cleared by the [place] FDA and has been authorized for detection and/or diagnosis of SARS-CoV-2 by FDA under an Emergency Use Authorization (EUA). This EUA will remain in effect (meaning this test can be used) for the duration of the COVID-19 declaration under Section 564(b)(1) of the Act, 21 U.S.C. section 360bbb-3(b)(1), unless the authorization is terminated or revoked.  Performed at [facility], [address] [place], [place] [address]   Comprehensive metabolic panel     Status: Abnormal   Collection Time: 05/19/23  9:30 AM  Result Value Ref Range   Sodium 131 (L) 135 - 145 mmol/L   Potassium 3.3 (L) 3.5 - 5.1 mmol/L   Chloride 101 98 - 111 mmol/L   CO2 19 (L) 22 - 32 mmol/L   Glucose, Bld 348 (H) 70 - 99 mg/dL    Comment: Glucose reference range applies only to samples taken after fasting for at least 8 hours.   BUN 15 8 - 23 mg/dL   Creatinine, Ser 3.08 0.44 - 1.00 mg/dL   Calcium 8.7 (L) 8.9 - 10.3 mg/dL   Total Protein 6.9 6.5 - 8.1 g/dL   Albumin 3.5 3.5 - 5.0 g/dL   AST 22 15 - 41 U/L   ALT 19 0 - 44 U/L    Alkaline Phosphatase 87 38 - 126 U/L   Total Bilirubin 0.5 <1.2 mg/dL   GFR, Estimated >65 >78 mL/min    Comment: (NOTE) Calculated using the CKD-EPI Creatinine Equation (2021)    Anion gap 11 5 - 15    Comment: Performed at [facility], [address] [place], [place] [address]  CBC with Differential     Status: Abnormal   Collection Time: 05/19/23  9:30 AM  Result Value Ref Range   WBC 13.1 (H) 4.0 - 10.5 K/uL   RBC 4.90 3.87 - 5.11 MIL/uL   Hemoglobin 11.7 (L) 12.0 - 15.0 g/dL   HCT 95.2 84.1 - 32.4 %   MCV 77.6 (L) 80.0 - 100.0 fL   MCH 23.9 (L) 26.0 - 34.0 pg   MCHC 30.8 30.0 - 36.0 g/dL   RDW 40.1 (  H) 11.5 - 15.5 %   Platelets 194 150 - 400 K/uL   nRBC 0.0 0.0 - 0.2 %   Neutrophils Relative % 87 %   Neutro Abs 11.5 (H) 1.7 - 7.7 K/uL   Lymphocytes Relative 5 %   Lymphs Abs 0.6 (L) 0.7 - 4.0 K/uL   Monocytes Relative 7 %   Monocytes Absolute 0.9 0.1 - 1.0 K/uL   Eosinophils Relative 0 %   Eosinophils Absolute 0.0 0.0 - 0.5 K/uL   Basophils Relative 0 %   Basophils Absolute 0.0 0.0 - 0.1 K/uL   Immature Granulocytes 1 %   Abs Immature Granulocytes 0.07 0.00 - 0.07 K/uL    Comment: Performed at [facility], [address] [place], [place] [address]  Protime-INR     Status: None   Collection Time: 05/19/23  9:30 AM  Result Value Ref Range   Prothrombin Time 14.2 11.4 - 15.2 seconds   INR 1.1 0.8 - 1.2    Comment: (NOTE) INR goal varies based on device and disease states. Performed at [facility], [address] [place], [place] [address]   Troponin I (High Sensitivity)     Status: Abnormal   Collection Time: 05/19/23  9:30 AM  Result Value Ref Range   Troponin I (High Sensitivity) 18 (H) <18 ng/L    Comment: (NOTE) Elevated high sensitivity troponin I (hsTnI) values and significant  changes across serial measurements may suggest ACS but many other  chronic and acute conditions are known to elevate hsTnI results.  Refer to the "Links"  section for chest pain algorithms and additional  guidance. Performed at [facility], [address], [place], [place] [address]   Brain natriuretic peptide     Status: None   Collection Time: 05/19/23  9:30 AM  Result Value Ref Range   B Natriuretic Peptide 39.2 0.0 - 100.0 pg/mL    Comment: Performed at [facility], [address], [place], [place] [address]  I-Stat Lactic Acid, ED     Status: Abnormal   Collection Time: 05/19/23  9:44 AM  Result Value Ref Range   Lactic Acid, Venous 4.4 (HH) 0.5 - 1.9 mmol/L   Comment NOTIFIED PHYSICIAN   I-Stat Lactic Acid, ED     Status: Abnormal   Collection Time: 05/19/23 12:18 PM  Result Value Ref Range   Lactic Acid, Venous 3.6 (HH) 0.5 - 1.9 mmol/L   Comment NOTIFIED PHYSICIAN    DG Shoulder Left  Result Date: 05/19/2023 CLINICAL DATA:  Left shoulder pain. EXAM: LEFT SHOULDER - 2+ VIEW COMPARISON:  None Available. FINDINGS: There is no evidence of fracture or dislocation. Moderate degenerative changes seen involving left acromioclavicular joint. Soft tissues are unremarkable. 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 No acute abnormality seen. Electronically Signed   By: Lupita Raider M.D.   On: 05/19/2023 11:01   DG Chest 2 View  Result Date: 05/19/2023 CLINICAL DATA:  Shortness of breath, cough. EXAM: CHEST - 2 VIEW COMPARISON:  August 28, 2007. FINDINGS: Possible mild cardiomegaly. Left lung is clear. Right basilar atelectasis or infiltrate is noted with possible associated effusion. Right upper lobe opacity is noted concerning for pneumonia with possible nodular density toward the apex. Bony thorax is unremarkable. IMPRESSION: Right basilar atelectasis or infiltrate is noted with possible associated effusion. Right upper lobe opacity is also noted concerning for pneumonia, with another possible nodular density toward the apex. CT scan of  the chest is recommended for further evaluation.  Electronically Signed   By: Lupita Raider M.D.   On: 05/19/2023 10:59    Pending Labs Unresulted Labs (From admission, onward)     Start     Ordered   05/20/23 0500  CBC  Tomorrow morning,   R        05/19/23 1345   05/20/23 0500  Basic metabolic panel  Tomorrow morning,   R        05/19/23 1345   05/19/23 0920  Culture, blood (Routine x 2)  BLOOD CULTURE X 2,   R (with STAT occurrences)      05/19/23 0921   05/19/23 0920  Urinalysis, w/ Reflex to Culture (Infection Suspected) -Urine, Clean Catch  Once,   URGENT       Question:  Specimen Source  Answer:  Urine, Clean Catch   05/19/23 0921            Vitals/Pain Today's Vitals   05/19/23 1100 05/19/23 1115 05/19/23 1245 05/19/23 1309  BP:  129/89 114/63 (!) 108/54  Pulse:  92 81   Resp:  18 (!) 21   Temp: 98.7 F (37.1 C)     TempSrc: Oral     SpO2:  96% 95%   Weight:      Height:      PainSc:        Isolation Precautions No active isolations  Medications Medications  sodium chloride 0.9 % bolus 1,000 mL (has no administration in time range)  insulin aspart (novoLOG) injection 0-15 Units (has no administration in time range)  cefTRIAXone (ROCEPHIN) 1 g in sodium chloride 0.9 % 100 mL IVPB (has no administration in time range)  azithromycin (ZITHROMAX) 500 mg in sodium chloride 0.9 % 250 mL IVPB (has no administration in time range)  acetaminophen (TYLENOL) tablet 650 mg (has no administration in time range)  lidocaine (LIDODERM) 5 % 1 patch (has no administration in time range)  diclofenac Sodium (VOLTAREN) 1 % topical gel 4 g (has no administration in time range)  sodium chloride 0.9 % bolus 1,000 mL (0 mLs Intravenous Stopped 05/19/23 1104)  acetaminophen (TYLENOL) tablet 325 mg (325 mg Oral Given 05/19/23 0946)  ibuprofen (ADVIL) tablet 800 mg (800 mg Oral Given 05/19/23 0946)  cefTRIAXone (ROCEPHIN) 1 g in sodium chloride 0.9 % 100 mL IVPB (0 g Intravenous Stopped 05/19/23 1024)  azithromycin (ZITHROMAX) 500 mg  in sodium chloride 0.9 % 250 mL IVPB (0 mg Intravenous Stopped 05/19/23 1206)    Mobility walks with device     Focused Assessments Pulmonary Assessment Handoff:  Lung sounds: Bilateral Breath Sounds: Rhonchi L Breath Sounds: Rhonchi R Breath Sounds: Rhonchi O2 Device: Room Air      R Recommendations: See Admitting Provider Note  Report given to:   Additional Notes:

## 2023-05-19 NOTE — ED Triage Notes (Signed)
Pt BIB [facility] from home with SOB, generalized weakness, cough, and body aches since last night. Temp 101.8, 110 HR, and BP 116/64 per EMS, 92% on RA, 95% on 2L [place]. 650 tylenol PO given by EMS.

## 2023-05-19 NOTE — ED Notes (Signed)
Patient to xray at this time

## 2023-05-19 NOTE — ED Provider Notes (Signed)
[place] [facility]PART[facility] AT [facility] Provider Note   CSN: [id number] Arrival date & time: 05/19/23  1324     History  Chief Complaint  Patient presents with   Weakness   Shortness of Breath   HPI Brooke Leon is a 72 y.o. female with Bipolar 1, DM, and hypothyroidism presenting for cough and shortness of breath.  Symptoms started last night with fever.  States she lives alone in her apartment but last night she went out with some friends.  Shortly after that she developed her symptoms.  Denies chest pain.  Also endorsing generalized weakness.  Also mention pain in the left shoulder that is been going on for couple weeks.  Denies any trauma.  States she is having difficulty moving the shoulder up and down.  Denies urinary symptoms.  Denies abdominal pain nausea vomiting diarrhea.   Weakness Associated symptoms: shortness of breath   Shortness of Breath      Home Medications Prior to Admission medications   Medication Sig Start Date End Date Taking? Authorizing Provider  simvastatin (ZOCOR) 20 MG tablet Take 20 mg by mouth at bedtime. 02/19/23  Yes [provider]  ALPRAZolam Prudy Feeler) 1 MG tablet Take 1 mg by mouth at bedtime as needed for anxiety.    [provider]  brimonidine (ALPHAGAN) 0.2 % ophthalmic solution Place 1 drop into the right eye 3 (three) times daily. 02/24/18   Cuthriell, Delorise Royals, PA-C  cetirizine (ZYRTEC) 10 MG tablet Take 10 mg by mouth daily. 03/04/23   [provider]  doxycycline (VIBRAMYCIN) 100 MG capsule Take 1 capsule (100 mg total) by mouth 2 (two) times daily. 06/15/20   Tommie Sams, DO  EPINEPHrine 0.3 mg/0.3 mL IJ SOAJ injection Inject into the muscle once.    [provider]  fluticasone (FLONASE) 50 MCG/ACT nasal spray Place 1 spray into both nostrils 2 (two) times daily. 11/26/22   [provider]  gabapentin (NEURONTIN) 300 MG capsule TAKE 300 MG TWICE DAILY FOR 2 WEEKS, THEN  INCREASE TO 600 MG TWICE DAILY AND CONTINUE THIS DOSE. 05/14/23   [provider]  glipiZIDE (GLUCOTROL XL) 2.5 MG 24 hr tablet Take 2.5 mg by mouth daily with breakfast.    [provider]  HYDROcodone-acetaminophen (NORCO/VICODIN) 5-325 MG tablet Take 1 tablet by mouth every 6 (six) hours as needed for moderate pain. 04/23/19   Tommi Rumps, PA-C  lamoTRIgine (LAMICTAL) 100 MG tablet Take 100 mg by mouth daily.    [provider]  levothyroxine (SYNTHROID) 150 MCG tablet Take 150 mcg by mouth daily. 04/18/23   [provider]  levothyroxine (SYNTHROID, LEVOTHROID) 137 MCG tablet Take 137 mcg by mouth daily before breakfast.    [provider]  metFORMIN (GLUCOPHAGE) 500 MG tablet Take 500 mg by mouth 2 (two) times daily with a meal.    [provider]  omeprazole (PRILOSEC) 40 MG capsule Take 40 mg by mouth daily.    [provider]  OZEMPIC, 0.25 OR 0.5 MG/DOSE, 2 MG/3ML SOPN Inject 0.5 mg into the skin once a week. 03/07/23   [provider]  simvastatin (ZOCOR) 10 MG tablet Take 10 mg by mouth daily.    [provider]  traZODone (DESYREL) 50 MG tablet TAKE 1 TABLET BY MOUTH NIGHTLY FOR 2-3 NIGHTS, THEN MAY INCREASE TO 2-3 TABS NIGHTLY IF NEEDED 04/22/23   [provider]  venlafaxine XR (EFFEXOR-XR) 150 MG 24 hr capsule Take 150  mg by mouth daily with breakfast.    [provider]      Allergies    Benadryl [diphenhydramine], Penicillins, and Sulfa antibiotics    Review of Systems   Review of Systems  Respiratory:  Positive for shortness of breath.   Neurological:  Positive for weakness.    Physical Exam   Vitals:   05/19/23 1100 05/19/23 1115  BP:  129/89  Pulse:  92  Resp:  18  Temp: 98.7 F (37.1 C)   SpO2:  96%    CONSTITUTIONAL:  well-appearing, NAD NEURO:  Alert and oriented x 3, CN 3-12 grossly intact EYES:  eyes equal and reactive ENT/NECK:  Supple, no stridor   CARDIO:  Tachycardia and regular rhythm, appears well-perfused  PULM:  No respiratory distress, tachypneic,diminished on right, crackles heard in the right mid to lower lung fields GI/GU:  non-distended, soft, non tender MSK/SPINE:  No gross deformities, no edema, moves all extremities.  Left shoulder without appreciable edema or erythema.  Tenderness to the shoulder elicited with flexion extension of the left arm. SKIN:  no rash, atraumatic  *Additional and/or pertinent findings included in MDM below   ED Results / Procedures / Treatments   Labs (all labs ordered are listed, but only abnormal results are displayed) Labs Reviewed  COMPREHENSIVE METABOLIC PANEL - Abnormal; Notable for the following components:      Result Value   Sodium 131 (*)    Potassium 3.3 (*)    CO2 19 (*)    Glucose, Bld 348 (*)    Calcium 8.7 (*)    All other components within normal limits  CBC WITH DIFFERENTIAL/PLATELET - Abnormal; Notable for the following components:   WBC 13.1 (*)    Hemoglobin 11.7 (*)    MCV 77.6 (*)    MCH 23.9 (*)    RDW 18.1 (*)    Neutro Abs 11.5 (*)    Lymphs Abs 0.6 (*)    All other components within normal limits  I-STAT CG4 LACTIC ACID, ED - Abnormal; Notable for the following components:   Lactic Acid, Venous 4.4 (*)    All other components within normal limits  TROPONIN I (HIGH SENSITIVITY) - Abnormal; Notable for the following components:   Troponin I (High Sensitivity) 18 (*)    All other components within normal limits  RESP PANEL BY RT-PCR (RSV, FLU A&B, COVID)  RVPGX2  CULTURE, BLOOD (ROUTINE X 2)  CULTURE, BLOOD (ROUTINE X 2)  PROTIME-INR  TSH  BRAIN NATRIURETIC PEPTIDE  URINALYSIS, W/ REFLEX TO CULTURE (INFECTION SUSPECTED)  I-STAT CG4 LACTIC ACID, ED  TROPONIN I (HIGH SENSITIVITY)    EKG EKG Interpretation Date/Time:  Wednesday May 19 2023 09:11:56 EST Ventricular Rate:  109 PR Interval:  156 QRS Duration:  89 QT Interval:  325 QTC  Calculation: 438 R Axis:   55  Text Interpretation: Sinus tachycardia Baseline wander Confirmed by Cathren Laine ([id number]) on 05/19/2023 9:21:53 AM  Radiology DG Shoulder Left  Result Date: 05/19/2023 CLINICAL DATA:  Left shoulder pain. EXAM: LEFT SHOULDER - 2+ VIEW COMPARISON:  None Available. FINDINGS: There is no evidence of fracture or dislocation. Moderate degenerative changes seen involving left acromioclavicular joint. Soft tissues are unremarkable. 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 No acute abnormality seen. Electronically Signed   By: Lupita Raider M.D.   On: 05/19/2023 11:01   DG Chest 2 View  Result Date: 05/19/2023 CLINICAL DATA:  Shortness of breath, cough. EXAM: CHEST -  2 VIEW COMPARISON:  August 28, 2007. FINDINGS: Possible mild cardiomegaly. Left lung is clear. Right basilar atelectasis or infiltrate is noted with possible associated effusion. Right upper lobe opacity is noted concerning for pneumonia with possible nodular density toward the apex. Bony thorax is unremarkable. IMPRESSION: Right basilar atelectasis or infiltrate is noted with possible associated effusion. Right upper lobe opacity is also noted concerning for pneumonia, with another possible nodular density toward the apex. CT scan of the chest is recommended for further evaluation. Electronically Signed   By: Lupita Raider M.D.   On: 05/19/2023 10:59    Procedures .Critical Care  Performed by: Gareth Eagle, PA-C Authorized by: Gareth Eagle, PA-C   Critical care provider statement:    Critical care time (minutes):  30   Critical care was necessary to treat or prevent imminent or life-threatening deterioration of the following conditions:  Sepsis   Critical care was time spent personally by me on the following activities:  Development of treatment plan with patient or surrogate, discussions with consultants, evaluation of patient's response to treatment,  examination of patient, ordering and review of laboratory studies, ordering and review of radiographic studies, ordering and performing treatments and interventions, pulse oximetry, re-evaluation of patient's condition and review of old charts     Medications Ordered in ED Medications  sodium chloride 0.9 % bolus 1,000 mL (0 mLs Intravenous Stopped 05/19/23 1104)  acetaminophen (TYLENOL) tablet 325 mg (325 mg Oral Given 05/19/23 0946)  ibuprofen (ADVIL) tablet 800 mg (800 mg Oral Given 05/19/23 0946)  cefTRIAXone (ROCEPHIN) 1 g in sodium chloride 0.9 % 100 mL IVPB (0 g Intravenous Stopped 05/19/23 1024)  azithromycin (ZITHROMAX) 500 mg in sodium chloride 0.9 % 250 mL IVPB (0 mg Intravenous Stopped 05/19/23 1206)    ED Course/ Medical Decision Making/ A&P Clinical Course as of 05/19/23 1215  Wed May 19, 2023  1191 Troponin I (High Sensitivity) [JR]    Clinical Course User Index [JR] Gareth Eagle, PA-C                                 Medical Decision Making Amount and/or Complexity of Data Reviewed Labs: ordered. Decision-making details documented in ED Course. Radiology: ordered.  Risk OTC drugs. Prescription drug management.   Initial Impression and Ddx 72 yo well appearing female presenting for cough and shortness of breath.  Initially febrile, tachycardic and tachypneic.  Exam notable for crackles and diminished breath sounds in right mid to lower lung fields. Ddx includes pneumonia, sepsis, CHF, pneumothorax, ACS, PE, other. Patient PMH that increases complexity of ED encounter:  Bipolar 1, DM, and hypothyroidism  Interpretation of Diagnostics - I independent reviewed and interpreted the labs as followed: Leukocytosis, lactic acidosis, elevated troponin, hyponatremia, hypokalemia  - I independently visualized the following imaging with scope of interpretation limited to determining acute life threatening conditions related to emergency care: Chest x-ray, which revealed right  basilar atelectasis or infiltrate is noted with possible associated effusion. Right upper lobe opacity is also noted concerning for pneumonia, with another possible nodular density toward the apex.  - I personally reviewed and interpreted EKG which revealed sinus tachycardia  Patient Reassessment and Ultimate Disposition/Management Workup was suggestive of sepsis secondary to pneumonia.  Treated with IV antibiotics.  Heart rate improved and patient defervesced with treatment of IV fluids and Tylenol and ibuprofen.  Patient has remained stable on room air.  Chest  x-ray revealed concerns for pneumonia and questionable effusion.  Admitted to hospital service for ongoing management.  Dr. Rudene Christians is admitting physicians.   Patient management required discussion with the following services or consulting groups:  Hospitalist Service  Complexity of Problems Addressed Acute complicated illness or Injury  Additional Data Reviewed and Analyzed Further history obtained from: Prior ED visit notes and Recent discharge summary  Patient Encounter Risk Assessment Consideration of hospitalization         Final Clinical Impression(s) / ED Diagnoses Final diagnoses:  Cough, unspecified type  Shortness of breath    Rx / DC Orders ED Discharge Orders     None         Gareth Eagle, PA-C 05/19/23 1216    Cathren Laine, MD 05/19/23 1237

## 2023-05-19 NOTE — ED Notes (Signed)
Pt assisted to restroom. ED stretched exchanged for hospital bed at this time. Pt assisted with positioning and comfort. Pt reports headache and this RN will give PRN tylenol as ordered. Pt denies other needs at this time.

## 2023-05-19 NOTE — ED Notes (Addendum)
I-stat lactic acid 3.62 mmol/L

## 2023-05-19 NOTE — H&P (Signed)
Date: 05/19/2023  Patient name: Brooke Leon  Medical record number: [id number]  Date of birth: 1950/09/20   I have seen and evaluated Karsten Fells and discussed their care with the Residency Team.   [phone] woman with T2DM with peripheral neuropathy, bipolar 1 disorder, childhood asthma, and GERD who presents with 1 day of subjective fever, chills, cough, and shortness of breath.  She was at her baseline state of health until yesterday evening, when she developed whole body chills. She turned the heat up, then felt feverish. She started coughing a dry cough, then went to bed. This morning, she continued to have chills and cough, and now with shortness of breath. She has not had any chest pain, wheezing, nausea, vomiting, abdominal pain, or lower extremity swelling.    Her other concern is 4-5 weeks of left shoulder pain. Pain is worse with shoulder abduction or moving her arm behind her back. It's better with rest. No preceding trauma and no prior shoulder disease or pain. No numbness, tingling, or weakness in her left arm. She's been using a topical gel from a friend with some relief.   PMHx, Fam Hx, and/or Soc Hx :   PMH notable for: - Childhood asthma, not currently on inhalers - T2DM, on glipizide, glucose typically 100s at home - Bipolar 1 disorder - Anxiety - Hypothyroidism - GERD  Fam Hx noncontributory  Social History - Lives in [facility] alone, has an aid Tammy that comes 6 days a week to assist with ADLs including dressing, cooking, and medication management - No alcohol, tobacco, or drug use - Walks with a walker - Established with PCP Dr Burnett Sheng at [facility]  Meds reviewed - see admin med rec She's not on insulin Not on inhalers  Allergies reviewed & confirmed - PCN allergy as a child, Sulfa allergy, Allergy to benadryl   Vitals:   05/19/23 1245 05/19/23 1309  BP: 114/63 (!) 108/54  Pulse: 81   Resp: (!) 21   Temp:    SpO2:  95%    GEN: Well appearing, sitting up in bed, NAD, nontoxic HEENT: no scleral icterus, mmm, normal OP CV: rrr, 3/6 systolic murmur best heart at LUSB, no r/b PULM: poor lung movement in right base, no wheezes or crackles in other lung fields. Breathing comfortably on room air and speaking in full sentences ABD: soft, NT/ND, normal bowel sounds EXT: warm, no edema, symmetric VASC: 2+ DP pulses bilaterally MSK: limited shoulder abduction >45 degrees 2/2 pain, 5/5 strength in b/l UE   Labs & imaging reviewed Notable for glucose 348, Cr 0.96 WBC 13.1 with 87% PMNs Lactate 4.4 to 3.6  CXR with:  Right basilar atelectasis or infiltrate is noted with possible associated effusion. Right upper lobe opacity is also noted concerning for pneumonia, with another possible nodular density toward the apex. CT scan of the chest is recommended for further evaluation.  Assessment and Plan: 72yo woman with childhood asthma & T2DM who presented with acute onset of fevers, chills, and shortness of breath, admitted with sepsis (fever, leukocytosis, elevated lactate) secondary to community acquired pneumonia.   1. Sepsis 2/2 CAP: Her symptoms, exam, and imaging are consistent with CAP. She appears well. - Start Ceftriaxone 1g daily for 5 days & Azithromycin 500mg  daily for 3 days, first dose was given today - Bolus another 1L NS (she got 1L already). Repeat lactate after. Monitor blood pressures - Tylenol 650mg  PO as needed for fever  2. T2DM  with hyperglycemia: I think her sugars are acutely elevated in the setting of this infection - Carb modified diet - Start moderate intensity SSI. Will add long acting insulin if needed, but she's not on any insulin at home  - Hold home Glipizide  3. Left shoulder pain, likely 2/2 rotator cuff injury: This is not her presenting symptom, but has been bothering her for weeks - Voltaren gel & Lidocaine patch - Tylenol as needed - She will need outpatient physical  therapy. 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 TSH normal h ere - HLD: continue home simvastatin 20mg  daily  - GERD: continue home PPI, will use pantoprazole 40mg  daily here   CORE MEASURES - Diet: carb controlled, she can eat now - Code: Full, I confirmed on admission - level of care: med/surg - DVT ppx: Lovenox - Contact: Friend/caregiver Tammy, I updated her on admission  - Dispo: plan for back to [facility] once improved, likely 2 days    Mercie Eon, MD 12/4/20241:52 PM

## 2023-05-20 DIAGNOSIS — J189 Pneumonia, unspecified organism: Secondary | ICD-10-CM

## 2023-05-20 LAB — GLUCOSE, CAPILLARY
Glucose-Capillary: 165 mg/dL — ABNORMAL HIGH (ref 70–99)
Glucose-Capillary: 218 mg/dL — ABNORMAL HIGH (ref 70–99)
Glucose-Capillary: 129 mg/dL — ABNORMAL HIGH (ref 70–99)

## 2023-05-20 LAB — BASIC METABOLIC PANEL
Anion gap: 7 (ref 5–15)
BUN: 12 mg/dL (ref 8–23)
CO2: 24 mmol/L (ref 22–32)
Calcium: 8.4 mg/dL — ABNORMAL LOW (ref 8.9–10.3)
Chloride: 104 mmol/L (ref 98–111)
Creatinine, Ser: 0.81 mg/dL (ref 0.44–1.00)
GFR, Estimated: >60 mL/min (ref >60)
Glucose, Bld: 277 mg/dL — ABNORMAL HIGH (ref 70–99)
Potassium: 3.7 mmol/L (ref 3.5–5.1)
Sodium: 135 mmol/L (ref 135–145)

## 2023-05-20 LAB — CBC
HCT: 32 % — ABNORMAL LOW (ref 36.0–46.0)
Hemoglobin: 9.6 g/dL — ABNORMAL LOW (ref 12.0–15.0)
MCH: 23.5 pg — ABNORMAL LOW (ref 26.0–34.0)
MCHC: 30 g/dL (ref 30.0–36.0)
MCV: 78.4 fL — ABNORMAL LOW (ref 80.0–100.0)
Platelets: 185 10*3/uL (ref 150–400)
RBC: 4.08 MIL/uL (ref 3.87–5.11)
RDW: 18.7 % — ABNORMAL HIGH (ref 11.5–15.5)
WBC: 15.9 10*3/uL — ABNORMAL HIGH (ref 4.0–10.5)
nRBC: 0 % (ref 0.0–0.2)

## 2023-05-20 LAB — CBG MONITORING, ED
Glucose-Capillary: 293 mg/dL — ABNORMAL HIGH (ref 70–99)
Glucose-Capillary: 260 mg/dL — ABNORMAL HIGH (ref 70–99)

## 2023-05-20 MED ORDER — FLUTICASONE PROPIONATE 50 MCG/ACT NA SUSP
1.0000 | Freq: Every day | NASAL | Status: DC
  Administered 2023-05-20 – 2023-05-21 (×2): 1 via NASAL
  Filled 2023-05-20 (×2): qty 16

## 2023-05-20 MED ORDER — TRAZODONE HCL 50 MG PO TABS
50.0000 mg | ORAL_TABLET | Freq: Every evening | ORAL | Status: DC | PRN
  Administered 2023-05-20: 50 mg via ORAL
  Filled 2023-05-20: qty 1

## 2023-05-20 MED ORDER — POLYVINYL ALCOHOL 1.4 % OP SOLN: 1.0000 [drp] | OPHTHALMIC | Status: DC | PRN

## 2023-05-20 MED ORDER — LIDOCAINE 5 % EX PTCH
2.0000 | MEDICATED_PATCH | CUTANEOUS | Status: DC
  Administered 2023-05-20 – 2023-05-21 (×2): 2 via TRANSDERMAL
  Filled 2023-05-20 (×2): qty 2

## 2023-05-20 MED ORDER — ALUM & MAG HYDROXIDE-SIMETH 200-200-20 MG/5ML PO SUSP
15.0000 mL | Freq: Four times a day (QID) | ORAL | Status: DC | PRN
  Administered 2023-05-20: 15 mL via ORAL
  Filled 2023-05-20: qty 30

## 2023-05-20 MED ORDER — ALPRAZOLAM 0.5 MG PO TABS
1.0000 mg | ORAL_TABLET | Freq: Three times a day (TID) | ORAL | Status: DC | PRN
  Administered 2023-05-20 – 2023-05-21 (×2): 1 mg via ORAL
  Filled 2023-05-20 (×2): qty 2

## 2023-05-20 NOTE — Progress Notes (Signed)
Summary: Patient is a 72 yo with childhood asthma & T2DM who presented with acute onset of fevers, chills, and shortness of breath, admitted with sepsis (fever, leukocytosis, elevated lactate) secondary to community acquired pneumonia.   Subjective:  Day #1. No overnight events reported.   Patient states she feels well, denies any SOB, CP, confusion, or other acute complaint. Endorses eating, drinking, and voiding without issue. Still endorses some left arm pain but states the lidocaine patches help improve pain and discomfort. Discussed outpatient PT for left arm, patient agreeable.   Objective:  Vital signs in last 24 hours: Vitals:   05/20/23 0745 05/20/23 0948 05/20/23 1100 05/20/23 1309  BP: 127/76  (!) 143/76 (!) 156/75  Pulse: 73  77 78  Resp: 20  (!) 23 18  Temp:  98.2 F (36.8 C)  97.9 F (36.6 C)  TempSrc:  Oral  Oral  SpO2: 98%  100% 98%  Weight:      Height:          Latest Ref Rng & Units 05/20/2023    3:52 AM 05/19/2023    9:30 AM  CBC  WBC 4.0 - 10.5 K/uL 15.9  13.1   Hemoglobin 12.0 - 15.0 g/dL 9.6  16.1   Hematocrit 36.0 - 46.0 % 32.0  38.0   Platelets 150 - 400 K/uL 185  194        Latest Ref Rng & Units 05/20/2023    3:52 AM 05/19/2023    9:30 AM  BMP  Glucose 70 - 99 mg/dL 096  045   BUN 8 - 23 mg/dL 12  15   Creatinine 4.09 - 1.00 mg/dL 8.11  9.14   Sodium 782 - 145 mmol/L 135  131   Potassium 3.5 - 5.1 mmol/L 3.7  3.3   Chloride 98 - 111 mmol/L 104  101   CO2 22 - 32 mmol/L 24  19   Calcium 8.9 - 10.3 mg/dL 8.4  8.7     Physical Exam Constitutional: Patient is resting comfortably in bed in no acute distress.  CV: Regular rate and rhythm without murmurs on auscultation. No LE edema.  Pulmonary/Respiratory: Reduced lung signs right lower lobe. Normal respiratory effort on room air.  Abdominal: Soft, non-tender, non-distended. Positive bowel sounds.  Neuro: Alert and oriented, no focal deficits noted.  Skin: Warm and dry. Psych: Normal mood  and affect.   Assessment/Plan:  Principal Problem:   Community acquired pneumonia Active Problems:   CAP (community acquired pneumonia)  Patient is a 72 yo with childhood asthma & T2DM who presented with acute onset of fevers, chills, and shortness of breath, admitted with sepsis (fever, leukocytosis, elevated lactate) secondary to community acquired pneumonia.   Sepsis 2/2 CAP Her symptoms, exam, and imaging are consistent with CAP. Patient remains afebrile for 24 hours, denies SOB. Physical exam similar to admission exam with reduced lung sounds RLL. Patient on day 2 of IV antibiotics, will continue. CBC slightly up today at 15.9 compared to yesterday, will consider CT chest if CBC not improved.  - Continue Ceftriaxone 1g daily for 5 days & Azithromycin 500mg  daily for 3 days, first  - Continue Tylenol 650 mg PO as needed for fever   T2DM with hyperglycemia On home glipizide, held on admission. Elevated blood glucose on admission likely in the setting of this infection. CBGs improved today on moderate SSI. Will continue.   Left shoulder pain Improved with lidocaine patches. Recommend outpatient PT follow up for likely rotator  cuff injury.  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   Philomena Doheny, MD,  PGY-1 05/20/2023, 3:44 PM Pager: [phone] After 5pm on weekdays and 1pm on weekends: On Call pager [phone]

## 2023-05-20 NOTE — ED Notes (Signed)
ED TO INPATIENT HANDOFF REPORT  ED Nurse Name and Phone #: Victorino Dike [phone]  S Name/Age/Gender Brooke Leon 72 y.o. female Room/Bed: 038C/038C  Code Status   Code Status: Full Code  Home/SNF/Other Home Patient oriented to: self, place, time, and situation Is this baseline? Yes   Triage Complete: Triage complete  Chief Complaint Community acquired pneumonia [J18.9]  Triage Note Pt BIB [facility] from home with SOB, generalized weakness, cough, and body aches since last night. Temp 101.8, 110 HR, and BP 116/64 per EMS, 92% on RA, 95% on 2L [place]. 650 tylenol PO given by EMS.    Allergies Allergies  Allergen Reactions   Varicella Virus Vaccine Live Shortness Of Breath   Penicillins Other (See Comments)    Reaction type/severity unknown, childhood reaction. Tolerates cephalosporins    Benadryl [Diphenhydramine] Other (See Comments)    "Skin Crawling" sensation   Sulfa Antibiotics Other (See Comments)    "Skin Crawling" sensation    Level of Care/Admitting Diagnosis ED Disposition     ED Disposition  Admit   Condition  --   Comment  Hospital Area: [facility] [[id number]]  Level of Care: Telemetry Medical [104]  May admit patient to [facility] or [facility] if equivalent level of care is available:: No  Covid Evaluation: Confirmed COVID Negative  Diagnosis: Community acquired pneumonia [478295]  Admitting Physician: Mercie Eon [[id number]]  Attending Physician: Mercie Eon [[id number]]  Certification:: I certify this patient will need inpatient services for at least 2 midnights          B Medical/Surgery History Past Medical History:  Diagnosis Date   Anemia    Anxiety    Asthma    Barrett esophagus    Bipolar 1 disorder (HCC)    Colon polyp    Depression    Diabetes mellitus without complication (HCC)    GERD (gastroesophageal reflux disease)    Thyroid disease    Past Surgical History:  Procedure Laterality Date   CHOLECYSTECTOMY      1998   COLONOSCOPY WITH ESOPHAGOGASTRODUODENOSCOPY (EGD)     COLONOSCOPY WITH PROPOFOL N/A 09/03/2016   Procedure: COLONOSCOPY WITH PROPOFOL;  Surgeon: Christena Deem, MD;  Location: [facility] ENDOSCOPY;  Service: Endoscopy;  Laterality: N/A;   ESOPHAGOGASTRODUODENOSCOPY (EGD) WITH PROPOFOL N/A 09/03/2016   Procedure: ESOPHAGOGASTRODUODENOSCOPY (EGD) WITH PROPOFOL;  Surgeon: Christena Deem, MD;  Location: [facility] ENDOSCOPY;  Service: Endoscopy;  Laterality: N/A;     A IV Location/Drains/Wounds Patient Lines/Drains/Airways Status     Active Line/Drains/Airways     Name Placement date Placement time Site Days   Peripheral IV 05/19/23 18 G Left Antecubital 05/19/23  0922  Antecubital  1   Peripheral IV 05/19/23 20 G Right Antecubital 05/19/23  0923  Antecubital  1            Intake/Output Last 24 hours  Intake/Output Summary (Last 24 hours) at 05/20/2023 1139 Last data filed at 05/19/2023 1631 Gross per 24 hour  Intake 1250.35 ml  Output --  Net 1250.35 ml    Labs/Imaging Results for orders placed or performed during the hospital encounter of 05/19/23 (from the past 48 hour(s))  Culture, blood (Routine x 2)     Status: None (Preliminary result)   Collection Time: 05/19/23  9:20 AM   Specimen: BLOOD  Result Value Ref Range   Specimen Description BLOOD RIGHT ANTECUBITAL    Special Requests      BOTTLES DRAWN AEROBIC AND ANAEROBIC Blood Culture adequate volume  Culture      NO GROWTH < 24 HOURS Performed at [facility], [address] [place], [place] [address]    Report Status PENDING   TSH     Status: None   Collection Time: 05/19/23  9:20 AM  Result Value Ref Range   TSH 0.432 0.350 - 4.500 uIU/mL    Comment: Performed by a 3rd Generation assay with a functional sensitivity of <=0.01 uIU/mL. Performed at [facility], [address] [place], [place] [address]   Resp panel by RT-PCR (RSV, Flu A&B, Covid) Anterior Nasal Swab     Status: None    Collection Time: 05/19/23  9:24 AM   Specimen: Anterior Nasal Swab  Result Value Ref Range   SARS Coronavirus 2 by RT PCR NEGATIVE NEGATIVE   Influenza A by PCR NEGATIVE NEGATIVE   Influenza B by PCR NEGATIVE NEGATIVE    Comment: (NOTE) The Xpert Xpress SARS-CoV-2/FLU/RSV plus assay is intended as an aid in the diagnosis of influenza from Nasopharyngeal swab specimens and should not be used as a sole basis for treatment. Nasal washings and aspirates are unacceptable for Xpert Xpress SARS-CoV-2/FLU/RSV testing.  Fact Sheet for Patients: BloggerCourse.com  Fact Sheet for Healthcare Providers: SeriousBroker.it  This test is not yet approved or cleared by the [place] FDA and has been authorized for detection and/or diagnosis of SARS-CoV-2 by FDA under an Emergency Use Authorization (EUA). This EUA will remain in effect (meaning this test can be used) for the duration of the COVID-19 declaration under Section 564(b)(1) of the Act, 21 U.S.C. section 360bbb-3(b)(1), unless the authorization is terminated or revoked.     Resp Syncytial Virus by PCR NEGATIVE NEGATIVE    Comment: (NOTE) Fact Sheet for Patients: BloggerCourse.com  Fact Sheet for Healthcare Providers: SeriousBroker.it  This test is not yet approved or cleared by the [place] FDA and has been authorized for detection and/or diagnosis of SARS-CoV-2 by FDA under an Emergency Use Authorization (EUA). This EUA will remain in effect (meaning this test can be used) for the duration of the COVID-19 declaration under Section 564(b)(1) of the Act, 21 U.S.C. section 360bbb-3(b)(1), unless the authorization is terminated or revoked.  Performed at [facility], [address], [place], [place] [address]   Culture, blood (Routine x 2)     Status: None (Preliminary result)   Collection Time: 05/19/23  9:25 AM    Specimen: BLOOD LEFT HAND  Result Value Ref Range   Specimen Description BLOOD LEFT HAND    Special Requests      BOTTLES DRAWN AEROBIC AND ANAEROBIC Blood Culture adequate volume   Culture      NO GROWTH < 24 HOURS Performed at [facility], [address] [place], [place] [address]    Report Status PENDING   Comprehensive metabolic panel     Status: Abnormal   Collection Time: 05/19/23  9:30 AM  Result Value Ref Range   Sodium 131 (L) 135 - 145 mmol/L   Potassium 3.3 (L) 3.5 - 5.1 mmol/L   Chloride 101 98 - 111 mmol/L   CO2 19 (L) 22 - 32 mmol/L   Glucose, Bld 348 (H) 70 - 99 mg/dL    Comment: Glucose reference range applies only to samples taken after fasting for at least 8 hours.   BUN 15 8 - 23 mg/dL   Creatinine, Ser 4.03 0.44 - 1.00 mg/dL   Calcium 8.7 (L) 8.9 - 10.3 mg/dL  Total Protein 6.9 6.5 - 8.1 g/dL   Albumin 3.5 3.5 - 5.0 g/dL   AST 22 15 - 41 U/L   ALT 19 0 - 44 U/L   Alkaline Phosphatase 87 38 - 126 U/L   Total Bilirubin 0.5 <1.2 mg/dL   GFR, Estimated >54 >09 mL/min    Comment: (NOTE) Calculated using the CKD-EPI Creatinine Equation (2021)    Anion gap 11 5 - 15    Comment: Performed at [facility], [address], [place], [place] [address]  CBC with Differential     Status: Abnormal   Collection Time: 05/19/23  9:30 AM  Result Value Ref Range   WBC 13.1 (H) 4.0 - 10.5 K/uL   RBC 4.90 3.87 - 5.11 MIL/uL   Hemoglobin 11.7 (L) 12.0 - 15.0 g/dL   HCT 47.8 29.5 - 62.1 %   MCV 77.6 (L) 80.0 - 100.0 fL   MCH 23.9 (L) 26.0 - 34.0 pg   MCHC 30.8 30.0 - 36.0 g/dL   RDW 30.8 (H) 65.7 - 84.6 %   Platelets 194 150 - 400 K/uL   nRBC 0.0 0.0 - 0.2 %   Neutrophils Relative % 87 %   Neutro Abs 11.5 (H) 1.7 - 7.7 K/uL   Lymphocytes Relative 5 %   Lymphs Abs 0.6 (L) 0.7 - 4.0 K/uL   Monocytes Relative 7 %   Monocytes Absolute 0.9 0.1 - 1.0 K/uL   Eosinophils Relative 0 %   Eosinophils Absolute 0.0 0.0 - 0.5 K/uL   Basophils Relative 0 %    Basophils Absolute 0.0 0.0 - 0.1 K/uL   Immature Granulocytes 1 %   Abs Immature Granulocytes 0.07 0.00 - 0.07 K/uL    Comment: Performed at [facility], [address] [place], [place] [address]  Protime-INR     Status: None   Collection Time: 05/19/23  9:30 AM  Result Value Ref Range   Prothrombin Time 14.2 11.4 - 15.2 seconds   INR 1.1 0.8 - 1.2    Comment: (NOTE) INR goal varies based on device and disease states. Performed at [facility], [address], [place], [place] [address]   Troponin I (High Sensitivity)     Status: Abnormal   Collection Time: 05/19/23  9:30 AM  Result Value Ref Range   Troponin I (High Sensitivity) 18 (H) <18 ng/L    Comment: (NOTE) Elevated high sensitivity troponin I (hsTnI) values and significant  changes across serial measurements may suggest ACS but many other  chronic and acute conditions are known to elevate hsTnI results.  Refer to the "Links" section for chest pain algorithms and additional  guidance. Performed at [facility], [address], [place], [place] [address]   Brain natriuretic peptide     Status: None   Collection Time: 05/19/23  9:30 AM  Result Value Ref Range   B Natriuretic Peptide 39.2 0.0 - 100.0 pg/mL    Comment: Performed at [facility], [address], [place], [place] [address]  I-Stat Lactic Acid, ED     Status: Abnormal   Collection Time: 05/19/23  9:44 AM  Result Value Ref Range   Lactic Acid, Venous 4.4 (HH) 0.5 - 1.9 mmol/L   Comment NOTIFIED PHYSICIAN   I-Stat Lactic Acid, ED     Status: Abnormal   Collection Time: 05/19/23 12:18 PM  Result Value Ref Range   Lactic Acid, Venous 3.6 (HH) 0.5 - 1.9 mmol/L  Comment NOTIFIED PHYSICIAN   I-Stat CG4 Lactic Acid     Status: Abnormal   Collection Time: 05/19/23  3:15 PM  Result Value Ref Range   Lactic Acid, Venous 4.4 (HH) 0.5 - 1.9 mmol/L   Comment NOTIFIED PHYSICIAN   Urinalysis, w/ Reflex to Culture (Infection Suspected) -Urine,  Clean Catch     Status: Abnormal   Collection Time: 05/19/23  4:24 PM  Result Value Ref Range   Specimen Source URINE, CATHETERIZED    Color, Urine YELLOW YELLOW   APPearance CLEAR CLEAR   Specific Gravity, Urine 1.016 1.005 - 1.030   pH 5.0 5.0 - 8.0   Glucose, UA >=500 (A) NEGATIVE mg/dL   Hgb urine dipstick NEGATIVE NEGATIVE   Bilirubin Urine NEGATIVE NEGATIVE   Ketones, ur NEGATIVE NEGATIVE mg/dL   Protein, ur NEGATIVE NEGATIVE mg/dL   Nitrite NEGATIVE NEGATIVE   Leukocytes,Ua NEGATIVE NEGATIVE   RBC / HPF 0-5 0 - 5 RBC/hpf   WBC, UA 0-5 0 - 5 WBC/hpf    Comment:        Reflex urine culture not performed if WBC <=10, OR if Squamous epithelial cells >5. If Squamous epithelial cells >5 suggest recollection.    Bacteria, UA NONE SEEN NONE SEEN   Squamous Epithelial / HPF 0-5 0 - 5 /HPF   Mucus PRESENT     Comment: Performed at [facility], [address] [place], [place] [address]  CBG monitoring, ED     Status: Abnormal   Collection Time: 05/19/23  5:58 PM  Result Value Ref Range   Glucose-Capillary 243 (H) 70 - 99 mg/dL    Comment: Glucose reference range applies only to samples taken after fasting for at least 8 hours.  I-Stat CG4 Lactic Acid     Status: Abnormal   Collection Time: 05/19/23  6:55 PM  Result Value Ref Range   Lactic Acid, Venous 2.3 (HH) 0.5 - 1.9 mmol/L   Comment NOTIFIED PHYSICIAN   CBG monitoring, ED     Status: Abnormal   Collection Time: 05/20/23  2:39 AM  Result Value Ref Range   Glucose-Capillary 293 (H) 70 - 99 mg/dL    Comment: Glucose reference range applies only to samples taken after fasting for at least 8 hours.  CBC     Status: Abnormal   Collection Time: 05/20/23  3:52 AM  Result Value Ref Range   WBC 15.9 (H) 4.0 - 10.5 K/uL   RBC 4.08 3.87 - 5.11 MIL/uL   Hemoglobin 9.6 (L) 12.0 - 15.0 g/dL   HCT 75.6 (L) 43.3 - 29.5 %   MCV 78.4 (L) 80.0 - 100.0 fL   MCH 23.5 (L) 26.0 - 34.0 pg   MCHC 30.0 30.0 - 36.0 g/dL   RDW 18.8  (H) 41.6 - 15.5 %   Platelets 185 150 - 400 K/uL   nRBC 0.0 0.0 - 0.2 %    Comment: Performed at [facility], [address], [place], [place] [address]  Basic metabolic panel     Status: Abnormal   Collection Time: 05/20/23  3:52 AM  Result Value Ref Range   Sodium 135 135 - 145 mmol/L   Potassium 3.7 3.5 - 5.1 mmol/L   Chloride 104 98 - 111 mmol/L   CO2 24 22 - 32 mmol/L   Glucose, Bld 277 (H) 70 - 99 mg/dL    Comment: Glucose reference range applies only to samples taken after fasting for at least 8 hours.  BUN 12 8 - 23 mg/dL   Creatinine, Ser 0.98 0.44 - 1.00 mg/dL   Calcium 8.4 (L) 8.9 - 10.3 mg/dL   GFR, Estimated >11 >91 mL/min    Comment: (NOTE) Calculated using the CKD-EPI Creatinine Equation (2021)    Anion gap 7 5 - 15    Comment: Performed at [facility], [address] [place], [place] [address]  CBG monitoring, ED     Status: Abnormal   Collection Time: 05/20/23  7:58 AM  Result Value Ref Range   Glucose-Capillary 260 (H) 70 - 99 mg/dL    Comment: Glucose reference range applies only to samples taken after fasting for at least 8 hours.   DG Shoulder Left  Result Date: 05/19/2023 CLINICAL DATA:  Left shoulder pain. EXAM: LEFT SHOULDER - 2+ VIEW COMPARISON:  None Available. FINDINGS: There is no evidence of fracture or dislocation. Moderate degenerative changes seen involving left acromioclavicular joint. Soft tissues are unremarkable. 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 No acute abnormality seen. Electronically Signed   By: Lupita Raider M.D.   On: 05/19/2023 11:01   DG Chest 2 View  Result Date: 05/19/2023 CLINICAL DATA:  Shortness of breath, cough. EXAM: CHEST - 2 VIEW COMPARISON:  August 28, 2007. FINDINGS: Possible mild cardiomegaly. Left lung is clear. Right basilar atelectasis or infiltrate is noted with possible associated effusion. Right upper lobe opacity is noted concerning for pneumonia with  possible nodular density toward the apex. Bony thorax is unremarkable. IMPRESSION: Right basilar atelectasis or infiltrate is noted with possible associated effusion. Right upper lobe opacity is also noted concerning for pneumonia, with another possible nodular density toward the apex. CT scan of the chest is recommended for further evaluation. Electronically Signed   By: Lupita Raider M.D.   On: 05/19/2023 10:59    Pending Labs Unresulted Labs (From admission, onward)    None       Vitals/Pain Today's Vitals   05/20/23 0545 05/20/23 0745 05/20/23 0948 05/20/23 1100  BP: 125/68 127/76  (!) 143/76  Pulse: 71 73  77  Resp: 16 20  (!) 23  Temp:   98.2 F (36.8 C)   TempSrc:   Oral   SpO2: 100% 98%  100%  Weight:      Height:      PainSc:        Isolation Precautions No active isolations  Medications Medications  insulin aspart (novoLOG) injection 0-15 Units (8 Units Subcutaneous Given 05/20/23 0946)  cefTRIAXone (ROCEPHIN) 1 g in sodium chloride 0.9 % 100 mL IVPB (0 g Intravenous Stopped 05/20/23 1028)  azithromycin (ZITHROMAX) 500 mg in sodium chloride 0.9 % 250 mL IVPB (0 mg Intravenous Stopped 05/20/23 1135)  acetaminophen (TYLENOL) tablet 650 mg (650 mg Oral Given 05/19/23 2207)  simvastatin (ZOCOR) tablet 20 mg (20 mg Oral Given 05/19/23 2206)  ALPRAZolam (XANAX) tablet 1 mg (1 mg Oral Given 05/19/23 2203)  venlafaxine XR (EFFEXOR-XR) 24 hr capsule 150 mg (150 mg Oral Given 05/20/23 0946)  pantoprazole (PROTONIX) EC tablet 40 mg (40 mg Oral Given 05/20/23 0946)  gabapentin (NEURONTIN) capsule 300 mg (300 mg Oral Given 05/20/23 0947)  lidocaine (LIDODERM) 5 % 1 patch (1 patch Transdermal Patch Removed 05/20/23 0248)  diclofenac Sodium (VOLTAREN) 1 % topical gel 4 g (has no administration in time range)  levothyroxine (SYNTHROID) tablet 150 mcg (150 mcg Oral Given 05/20/23 0612)  lamoTRIgine (LAMICTAL) tablet 100 mg (100 mg Oral Given 05/20/23 0946)  fluticasone (FLONASE) 50 MCG/ACT  nasal spray 1 spray (has no administration in time range)  sodium chloride 0.9 % bolus 1,000 mL (0 mLs Intravenous Stopped 05/19/23 1104)  acetaminophen (TYLENOL) tablet 325 mg (325 mg Oral Given 05/19/23 0946)  ibuprofen (ADVIL) tablet 800 mg (800 mg Oral Given 05/19/23 0946)  cefTRIAXone (ROCEPHIN) 1 g in sodium chloride 0.9 % 100 mL IVPB (0 g Intravenous Stopped 05/19/23 1024)  azithromycin (ZITHROMAX) 500 mg in sodium chloride 0.9 % 250 mL IVPB (0 mg Intravenous Stopped 05/19/23 1206)  sodium chloride 0.9 % bolus 1,000 mL (0 mLs Intravenous Stopped 05/19/23 1631)  ALPRAZolam (XANAX) tablet 1 mg (1 mg Oral Given 05/19/23 1541)    Mobility walks     Focused Assessments Pulmonary Assessment Handoff:  Lung sounds: Bilateral Breath Sounds: Diminished L Breath Sounds: Rhonchi R Breath Sounds: Rhonchi O2 Device: Room Air      R Recommendations: See Admitting Provider Note  Report given to:   Additional Notes:

## 2023-05-20 NOTE — Inpatient Diabetes Management (Signed)
Inpatient Diabetes Program Recommendations  AACE/ADA: New Consensus Statement on Inpatient Glycemic Control (2015)  Target Ranges:  Prepandial:   less than 140 mg/dL      Peak postprandial:   less than 180 mg/dL (1-2 hours)      Critically ill patients:  140 - 180 mg/dL   Lab Results  Component Value Date   GLUCAP 260 (H) 05/20/2023    Review of Glycemic Control  Latest Reference Range & Units 05/19/23 17:58 05/20/23 02:39 05/20/23 07:58  Glucose-Capillary 70 - 99 mg/dL 161 (H) 096 (H) 045 (H)    Diabetes history: DM 2 Outpatient Diabetes medications: Glipizide 2.5 mg Daily, Metformin 500 mg bid, Ozempic 0.5 mg Weekly Current orders for Inpatient glycemic control:  Novolog 0-15 units tid + hs  Inpatient Diabetes Program Recommendations:    -   Consider Semglee 5 units  Thanks,  Christena Deem RN, MSN, BC-ADM Inpatient Diabetes Coordinator Team Pager [phone] (8a-5p)

## 2023-05-20 NOTE — Plan of Care (Signed)

## 2023-05-21 ENCOUNTER — Other Ambulatory Visit (HOSPITAL_COMMUNITY): Payer: Self-pay

## 2023-05-21 DIAGNOSIS — J189 Pneumonia, unspecified organism: Secondary | ICD-10-CM | POA: Diagnosis not present

## 2023-05-21 LAB — BASIC METABOLIC PANEL
Anion gap: 11 (ref 5–15)
BUN: 5 mg/dL — ABNORMAL LOW (ref 8–23)
CO2: 22 mmol/L (ref 22–32)
Calcium: 9 mg/dL (ref 8.9–10.3)
Chloride: 103 mmol/L (ref 98–111)
Creatinine, Ser: 0.68 mg/dL (ref 0.44–1.00)
GFR, Estimated: >60 mL/min (ref >60)
Glucose, Bld: 170 mg/dL — ABNORMAL HIGH (ref 70–99)
Potassium: 3.7 mmol/L (ref 3.5–5.1)
Sodium: 136 mmol/L (ref 135–145)

## 2023-05-21 LAB — CBC
HCT: 35.7 % — ABNORMAL LOW (ref 36.0–46.0)
Hemoglobin: 11.5 g/dL — ABNORMAL LOW (ref 12.0–15.0)
MCH: 24.5 pg — ABNORMAL LOW (ref 26.0–34.0)
MCHC: 32.2 g/dL (ref 30.0–36.0)
MCV: 76.1 fL — ABNORMAL LOW (ref 80.0–100.0)
Platelets: 236 10*3/uL (ref 150–400)
RBC: 4.69 MIL/uL (ref 3.87–5.11)
RDW: 18.5 % — ABNORMAL HIGH (ref 11.5–15.5)
WBC: 14 10*3/uL — ABNORMAL HIGH (ref 4.0–10.5)
nRBC: 0 % (ref 0.0–0.2)

## 2023-05-21 LAB — GLUCOSE, CAPILLARY
Glucose-Capillary: 168 mg/dL — ABNORMAL HIGH (ref 70–99)
Glucose-Capillary: 168 mg/dL — ABNORMAL HIGH (ref 70–99)
Glucose-Capillary: 169 mg/dL — ABNORMAL HIGH (ref 70–99)

## 2023-05-21 MED ORDER — DOXYCYCLINE HYCLATE 100 MG PO TABS
100.0000 mg | ORAL_TABLET | Freq: Two times a day (BID) | ORAL | 0 refills | Status: AC
  Filled 2023-05-21: qty 4, 2d supply, fill #0

## 2023-05-21 MED ORDER — DICLOFENAC SODIUM 1 % EX GEL
4.0000 g | Freq: Three times a day (TID) | CUTANEOUS | 0 refills | Status: AC | PRN
  Filled 2023-05-21: qty 50, 5d supply, fill #0

## 2023-05-21 MED ORDER — ACETAMINOPHEN 325 MG PO TABS: 650.0000 mg | ORAL_TABLET | Freq: Four times a day (QID) | ORAL | Status: AC | PRN

## 2023-05-21 MED ORDER — DOXYCYCLINE HYCLATE 100 MG PO TABS: 100.0000 mg | ORAL_TABLET | Freq: Two times a day (BID) | ORAL | Status: DC

## 2023-05-21 MED ORDER — LIDOCAINE 5 % EX PTCH: 2.0000 | MEDICATED_PATCH | CUTANEOUS | Status: AC

## 2023-05-21 NOTE — Hospital Course (Addendum)
Patient is a 72 yo with childhood asthma & T2DM who presented with acute onset of fevers, chills, and shortness of breath and admitted to the Internal Medicine Teaching Service on 12/4 for sepsis (fever, leukocytosis, elevated lactate) secondary to community acquired pneumonia.    Sepsis 2/2 CAP Patient's presentation consistent with CAP. CXR with right upper lobe opacity concerning for pneumonia and another possible nodular density toward apex. Patient started on IV ceftriaxone 1 g daily for 5 days and Azithromycin 500 mg daily for for 3 days. Patient remained afebrile over a 48 hour period after admission. Given Tylenol 650 mg as needed for fever. WBC stable on day of discharge, overall appears clinically well. Will be discharged with 2 additional days of PO doxycyline 100 mg BID. Consider CT scan for further evaluation if symptoms worsen. Recommend follow up CXR 6-8 weeks outpatient.   T2DM with hyperglycemia On home glipizide, held on admission. Elevated blood glucose on admission likely in the setting of this infection. CBGs improved today on moderate SSI. Will resume home glipizide on discharge.    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 Improved with lidocaine patches and voltaren gel. Recommend outpatient PT follow up for likely rotator cuff injury.    Chronic issues Bipolar disorder: Continued home Venlafaxine 150 mg daily & Lamictal 100 mg daily.  Anxiety: Continued home Xanax 1mg  TID PRN.  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 Continued home levothyroxine 150 mcg daily. HLD: Continued home simvastatin 20 mg daily. GERD: Continued home PPI, ordered pantoprazole 40 mg daily here.

## 2023-05-21 NOTE — Discharge Summary (Signed)
Name: Brooke Leon MRN: [id number] DOB: [birth year] 72 y.o. PCP: Jerl Mina, MD  Date of Admission: 05/19/2023  9:06 AM Date of Discharge: 05/21/2023 1:35 PM Attending Physician: Dr. Lafonda Mosses  Discharge Diagnosis: Principal Problem:   Community acquired pneumonia Active Problems:   CAP (community acquired pneumonia)    Discharge Medications: Allergies as of 05/21/2023       Reactions   Varicella Virus Vaccine Live Shortness Of Breath   Penicillins Other (See Comments)   Reaction type/severity unknown, childhood reaction. Tolerates cephalosporins   Benadryl [diphenhydramine] Other (See Comments)   "Skin Crawling" sensation   Sulfa Antibiotics Other (See Comments)   "Skin Crawling" sensation        Medication List     TAKE these medications    acetaminophen 325 MG tablet Commonly known as: TYLENOL Take 2 tablets (650 mg total) by mouth every 6 (six) hours as needed for mild pain (pain score 1-3) or fever.   ALPRAZolam 1 MG tablet Commonly known as: XANAX Take 1 mg by mouth 3 (three) times daily.   brimonidine 0.2 % ophthalmic solution Commonly known as: ALPHAGAN Place 1 drop into the right eye 3 (three) times daily.   cetirizine 10 MG tablet Commonly known as: ZYRTEC Take 10 mg by mouth daily.   diclofenac Sodium 1 % Gel Commonly known as: VOLTAREN Apply 4 g topically 3 (three) times daily as needed (left shoulder pain).   doxycycline 100 MG tablet Commonly known as: VIBRA-TABS Take 1 tablet (100 mg total) by mouth every 12 (twelve) hours for 2 days. Start taking on: May 22, 2023   EPINEPHrine 0.3 mg/0.3 mL Soaj injection Commonly known as: EPI-PEN Inject into the muscle once.   fluticasone 50 MCG/ACT nasal spray Commonly known as: FLONASE Place 1 spray into both nostrils 2 (two) times daily.   gabapentin 300 MG capsule Commonly known as: NEURONTIN Take 300-600 mg by mouth 2 (two) times daily. TAKE 300 MG TWICE DAILY FOR 2 WEEKS, THEN  INCREASE TO 600 MG TWICE DAILY AND CONTINUE THIS DOSE.   glipiZIDE 2.5 MG 24 hr tablet Commonly known as: GLUCOTROL XL Take 2.5 mg by mouth daily with breakfast.   lamoTRIgine 100 MG tablet Commonly known as: LAMICTAL Take 100 mg by mouth 2 (two) times daily.   levothyroxine 150 MCG tablet Commonly known as: SYNTHROID Take 150 mcg by mouth daily.   lidocaine 5 % Commonly known as: LIDODERM Place 2 patches onto the skin daily. Remove & Discard patch within 12 hours or as directed by MD   omeprazole 40 MG capsule Commonly known as: PRILOSEC Take 40 mg by mouth daily.   simvastatin 20 MG tablet Commonly known as: ZOCOR Take 20 mg by mouth at bedtime.   traZODone 50 MG tablet Commonly known as: DESYREL Take 100-150 mg by mouth at bedtime as needed for sleep.   venlafaxine XR 150 MG 24 hr capsule Commonly known as: EFFEXOR-XR Take 150 mg by mouth daily with breakfast.        Disposition and follow-up:   Ms.Laura S Lones was discharged from [facility] in Stable condition.  At the hospital follow up visit please address:  1.  Follow-up:  T2DM  - Patient's blood glucose elevated on admission, CBGs improved to 160s on day of discharge, resumed home glipizide. Follow up CBG outpatient.    Left shoulder pain - Left shoulder pain likely due to rotator cuff tear. Improved with conservative management (Tylenol, Voltaren gel, lidocaine  patches), but also recommended PT outpatient if still bothersome. Please follow up that patient discussed with therapist at [facility].    Sepsis 2/2 CAP - Patient appeared clinically well on day of discharge, remained afebrile, with 2 days remaining of PO doxycycline 100 mg BID delivered to her room. Given indications to call MD or come back to the hospital. Due to CXR with right upper lobe opacity concerning that was not well characterized, recommend repeat CXR for resolution in 6-8 weeks. Please consider CT chest if symptoms  worsen.   2.  Labs / imaging needed at time of follow-up: CBC, BMP, CBG  3.  Pending labs/ test needing follow-up:  - CXR 6-8 weeks outpatient   4.  Medication Changes  STOPPED  - n/a   ADDED  - Doxycycline 100 mg BID x 2 days   - Voltaren gel for left shoulder  - Lidocaine patches for left shoulder (OTC)  - Tylenol 650 mg q6H as needed for fever or mild pain (OTC)   MODIFIED  - n/a  Follow-up Appointments: - Patient to make hospital follow-up appointment with PCP  Hospital Course by problem list: Patient is a 72 yo with childhood asthma & T2DM who presented with acute onset of fevers, chills, and shortness of breath and admitted to the Internal Medicine Teaching Service on 12/4 for sepsis (fever, leukocytosis, elevated lactate) secondary to community acquired pneumonia.    Sepsis 2/2 CAP Patient's presentation consistent with CAP. CXR with right upper lobe opacity concerning for pneumonia and another possible nodular density toward apex. Patient started on IV ceftriaxone 1 g daily for 5 days and Azithromycin 500 mg daily for for 3 days. Patient remained afebrile over a 48 hour period after admission. Given Tylenol 650 mg as needed for fever. WBC stable on day of discharge, overall appears clinically well. Will be discharged with 2 additional days of PO doxycyline 100 mg BID. Consider CT scan for further evaluation if symptoms worsen. Recommend follow up CXR 6-8 weeks outpatient.   T2DM with hyperglycemia On home glipizide, held on admission. Elevated blood glucose on admission likely in the setting of this infection. CBGs improved today on moderate SSI. Will resume home glipizide on discharge.    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 Improved with lidocaine patches and voltaren gel. Recommend outpatient PT follow up for likely rotator cuff injury.    Chronic issues Bipolar disorder:  Continued home Venlafaxine 150 mg daily & Lamictal 100 mg daily.  Anxiety: Continued home Xanax 1mg  TID PRN.  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 Continued home levothyroxine 150 mcg daily. HLD: Continued home simvastatin 20 mg daily. GERD: Continued home PPI, ordered pantoprazole 40 mg daily here.   Discharge Subjective: Patient clinically well this morning. She denies any CP, SOB, N/V, issues with voiding, or other acute complaints. Continues to endorse improvement in left shoulder pain with lidocaine patches. Discussed importance of finishing up her antibiotic course at home and patient acknowledged understanding. She will discuss physical therapy with the therapists at her living community. Patient agreed to call her PCP to make a hospital follow up appointment.   Discharge Exam:   Blood pressure 123/73, pulse 75, temperature 98 F (36.7 C), temperature source Oral, resp. rate 16, height 5\' 8"  (1.727 m), weight 90.3 kg, SpO2 96%.  Constitutional: Patient is resting in bed in no acute distress, conversing appropriately.  CV: RRR, no m/r/g, no LE edema, warm extremities.  Pulmonary/Respiratory: Normal respiratory effort on room air, improved air movement right lower lobe with some crackles.  Abdominal: Soft, non-tender, non-distended.  Neuro: Alert and oriented, no focal deficits.  Skin: Warm and dry.  Psych: Normal mood and affect.   Pertinent Labs, Studies, and Procedures:     Latest Ref Rng & Units 05/21/2023    5:16 AM 05/20/2023    3:52 AM 05/19/2023    9:30 AM  CBC  WBC 4.0 - 10.5 K/uL 14.0  15.9  13.1   Hemoglobin 12.0 - 15.0 g/dL 02.7  9.6  25.3   Hematocrit 36.0 - 46.0 % 35.7  32.0  38.0   Platelets 150 - 400 K/uL 236  185  194        Latest Ref Rng & Units 05/21/2023    5:16 AM 05/20/2023    3:52 AM 05/19/2023    9:30 AM  CMP  Glucose 70 - 99 mg/dL 664  403  474   BUN 8 - 23 mg/dL 5  12  15    Creatinine 0.44 -  1.00 mg/dL 2.59  5.63  8.75   Sodium 135 - 145 mmol/L 136  135  131   Potassium 3.5 - 5.1 mmol/L 3.7  3.7  3.3   Chloride 98 - 111 mmol/L 103  104  101   CO2 22 - 32 mmol/L 22  24  19    Calcium 8.9 - 10.3 mg/dL 9.0  8.4  8.7   Total Protein 6.5 - 8.1 g/dL   6.9   Total Bilirubin <1.2 mg/dL   0.5   Alkaline Phos 38 - 126 U/L   87   AST 15 - 41 U/L   22   ALT 0 - 44 U/L   19     DG Shoulder Left  Result Date: 05/19/2023 CLINICAL DATA:  Left shoulder pain. EXAM: LEFT SHOULDER - 2+ VIEW COMPARISON:  None Available. FINDINGS: There is no evidence of fracture or dislocation. Moderate degenerative changes seen involving left acromioclavicular joint. Soft tissues are unremarkable. 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 No acute abnormality seen. Electronically Signed   By: Lupita Raider M.D.   On: 05/19/2023 11:01   DG Chest 2 View  Result Date: 05/19/2023 CLINICAL DATA:  Shortness of breath, cough. EXAM: CHEST - 2 VIEW COMPARISON:  August 28, 2007. FINDINGS: Possible mild cardiomegaly. Left lung is clear. Right basilar atelectasis or infiltrate is noted with possible associated effusion. Right upper lobe opacity is noted concerning for pneumonia with possible nodular density toward the apex. Bony thorax is unremarkable. IMPRESSION: Right basilar atelectasis or infiltrate is noted with possible associated effusion. Right upper lobe opacity is also noted concerning for pneumonia, with another possible nodular density toward the apex. CT scan of the chest is recommended for further evaluation. Electronically Signed   By: Lupita Raider M.D.   On: 05/19/2023 10:59     Discharge Instructions: Discharge Instructions     Call MD for:   Complete by: As directed    Call MD for:  difficulty breathing, headache or visual disturbances   Complete by: As directed    Call MD for:  extreme fatigue   Complete by: As directed    Call MD for:  hives   Complete by: As  directed    Call MD for:  persistant dizziness or light-headedness   Complete by: As directed    Call  MD for:  persistant nausea and vomiting   Complete by: As directed    Call MD for:  redness, tenderness, or signs of infection (pain, swelling, redness, odor or green/yellow discharge around incision site)   Complete by: As directed    Call MD for:  severe uncontrolled pain   Complete by: As directed    Call MD for:  temperature >100.4   Complete by: As directed    Diet general   Complete by: As directed    Discharge instructions   Complete by: As directed    Patient instructions:  - You were treated for community acquired pneumonia. You were given IV antibiotics and discharged from the hospital with two more days of an oral antibiotic call doxycyline which you will take twice a day. Common side effects include abdominal upset and sun sensitivity. Please finish the full course, if you experience any unwanted side effects please call your PCP.  - You can take Tylenol 650 mg every 6 hours as needed for fever or mild pain.  - You can continue to apply over the counter lidocaine patches to your left shoulder, changing them out every 12 hours. Please discuss physical therapy with the therapists at your living community.  - Please make a hospitalization appointment with your PCP Dr. Jerl Mina.  - If you have any new fever, sudden shortness of breath, or chest pain please go to your nearest emergency department as it may be a worsening of your symptoms.  - We are glad you are feeling better. It was a pleasure serving you during your stay. - Dr. Justin Mend and the Internal Medicine Teaching Service at [facility].   Increase activity slowly   Complete by: As directed       Signed: Rosemaria Inabinet Colbert Coyer, MD [facility] - PGY1 Pager: [phone] 05/21/2023, 1:35 PM    Please contact the on call pager after 5 pm and on weekends at [phone].

## 2023-05-21 NOTE — Discharge Instructions (Signed)
Patient instructions:  - You were treated for community acquired pneumonia. You were given IV antibiotics and discharged from the hospital with two more days of an oral antibiotic call doxycyline which you will take twice a day. Common side effects include abdominal upset and sun sensitivity. Please finish the full course, if you experience any unwanted side effects please call your PCP.  - You can take Tylenol 650 mg every 6 hours as needed for fever or mild pain.  - You can continue to apply over the counter lidocaine patches to your left shoulder, changing them out every 12 hours. Please discuss physical therapy with the therapists at your living community.  - Please make a hospitalization appointment with your PCP Dr. Jerl Mina.  - If you have any new fever, sudden shortness of breath, or chest pain please go to your nearest emergency department as it may be a worsening of your symptoms.  - We are glad you are feeling better. It was a pleasure serving you during your stay. - Dr. Justin Mend and the Internal Medicine Teaching Service at [facility].

## 2023-05-24 LAB — CULTURE, BLOOD (ROUTINE X 2)
Culture: NO GROWTH
Culture: NO GROWTH
Special Requests: ADEQUATE
Special Requests: ADEQUATE

## 2023-06-22 ENCOUNTER — Other Ambulatory Visit: Payer: Self-pay

## 2023-06-22 ENCOUNTER — Emergency Department (HOSPITAL_BASED_OUTPATIENT_CLINIC_OR_DEPARTMENT_OTHER): Payer: Medicare Other

## 2023-06-22 ENCOUNTER — Emergency Department (HOSPITAL_BASED_OUTPATIENT_CLINIC_OR_DEPARTMENT_OTHER): Payer: Medicare Other | Admitting: Radiology

## 2023-06-22 ENCOUNTER — Emergency Department (HOSPITAL_BASED_OUTPATIENT_CLINIC_OR_DEPARTMENT_OTHER)
Admission: EM | Admit: 2023-06-22 | Discharge: 2023-06-22 | Disposition: A | Discharge status: Home / Self Care | Payer: Medicare Other

## 2023-06-22 ENCOUNTER — Encounter (HOSPITAL_BASED_OUTPATIENT_CLINIC_OR_DEPARTMENT_OTHER): Payer: Self-pay | Admitting: Emergency Medicine

## 2023-06-22 DIAGNOSIS — E119 Type 2 diabetes mellitus without complications: Secondary | ICD-10-CM | POA: Diagnosis not present

## 2023-06-22 DIAGNOSIS — S40012A Contusion of left shoulder, initial encounter: Secondary | ICD-10-CM | POA: Insufficient documentation

## 2023-06-22 DIAGNOSIS — S8001XA Contusion of right knee, initial encounter: Secondary | ICD-10-CM | POA: Insufficient documentation

## 2023-06-22 DIAGNOSIS — S8002XA Contusion of left knee, initial encounter: Secondary | ICD-10-CM | POA: Diagnosis not present

## 2023-06-22 DIAGNOSIS — S8000XA Contusion of unspecified knee, initial encounter: Secondary | ICD-10-CM

## 2023-06-22 DIAGNOSIS — J45909 Unspecified asthma, uncomplicated: Secondary | ICD-10-CM | POA: Diagnosis not present

## 2023-06-22 DIAGNOSIS — S4992XA Unspecified injury of left shoulder and upper arm, initial encounter: Secondary | ICD-10-CM | POA: Diagnosis present

## 2023-06-22 DIAGNOSIS — R519 Headache, unspecified: Secondary | ICD-10-CM | POA: Diagnosis present

## 2023-06-22 DIAGNOSIS — S0990XA Unspecified injury of head, initial encounter: Secondary | ICD-10-CM | POA: Diagnosis present

## 2023-06-22 DIAGNOSIS — M542 Cervicalgia: Secondary | ICD-10-CM | POA: Diagnosis not present

## 2023-06-22 MED ORDER — ACETAMINOPHEN 325 MG PO TABS
650.0000 mg | ORAL_TABLET | Freq: Once | ORAL | Status: AC
  Administered 2023-06-22: 650 mg via ORAL
  Filled 2023-06-22: qty 2
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
## 2023-06-22 NOTE — Discharge Instructions (Signed)
 Your workup today was reassuring.  Please take Tylenol at home as needed for pain.  Follow-up with your doctor.  Return to the ER for worsening symptoms.
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
## 2023-09-02 ENCOUNTER — Other Ambulatory Visit: Payer: Self-pay | Admitting: Family Medicine

## 2023-09-02 DIAGNOSIS — Z0000 Encounter for general adult medical examination without abnormal findings: Secondary | ICD-10-CM

## 2023-09-02 DIAGNOSIS — R9389 Abnormal findings on diagnostic imaging of other specified body structures: Secondary | ICD-10-CM

## 2023-09-07 ENCOUNTER — Ambulatory Visit (HOSPITAL_COMMUNITY)
Admission: RE | Admit: 2023-09-07 | Discharge: 2023-09-07 | Disposition: A | Discharge status: Home / Self Care | Source: Ambulatory Visit | Attending: Family Medicine | Admitting: Family Medicine

## 2023-09-07 DIAGNOSIS — R9389 Abnormal findings on diagnostic imaging of other specified body structures: Secondary | ICD-10-CM | POA: Diagnosis present

## 2023-09-07 DIAGNOSIS — Z0000 Encounter for general adult medical examination without abnormal findings: Secondary | ICD-10-CM | POA: Insufficient documentation

## 2024-06-19 ENCOUNTER — Other Ambulatory Visit: Payer: Self-pay | Admitting: Family Medicine

## 2024-06-19 DIAGNOSIS — R911 Solitary pulmonary nodule: Secondary | ICD-10-CM
